# Patient Record
Sex: Male | Born: 2000 | Race: White | Hispanic: No | Marital: Single | Smoking: Never smoker
Health system: Southern US, Community
[De-identification: ages and names within clinical notes are randomized; demographics above are authoritative.]

## PROBLEM LIST (undated history)

## (undated) HISTORY — PX: NO PAST SURGERIES: SHX2092

---
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
## 2023-05-06 IMAGING — DX DG ELBOW COMPLETE 3+V*R*
4 series · 4 of 4 positions shown · non-contrast
Comparison: None.

CLINICAL DATA: Pain medial epicondyle.  Pitcher

EXAM:
RIGHT ELBOW - COMPLETE 3+ VIEW

[elbow ap]
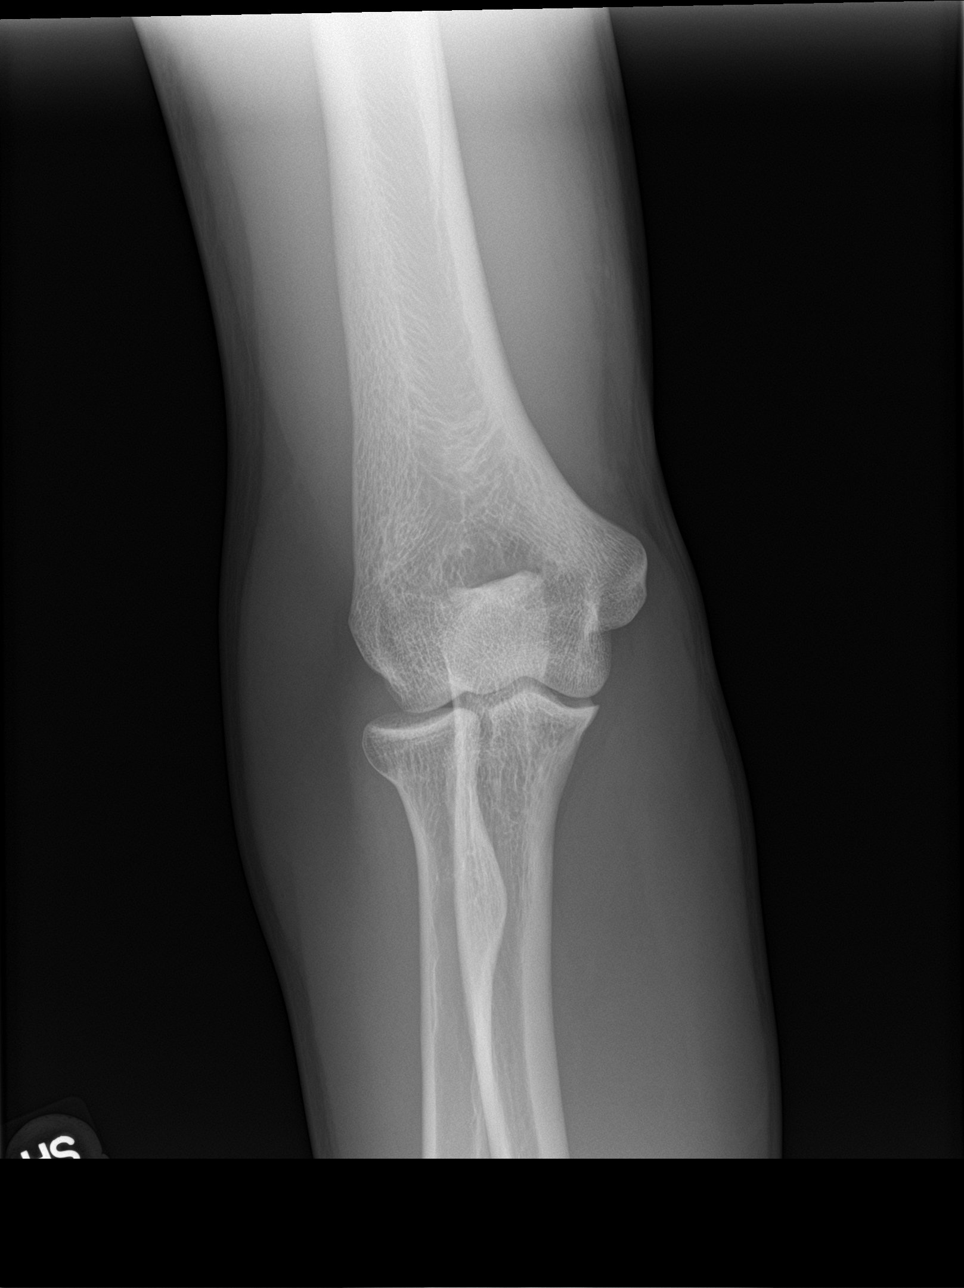

[elbow obl (1 of 2)]
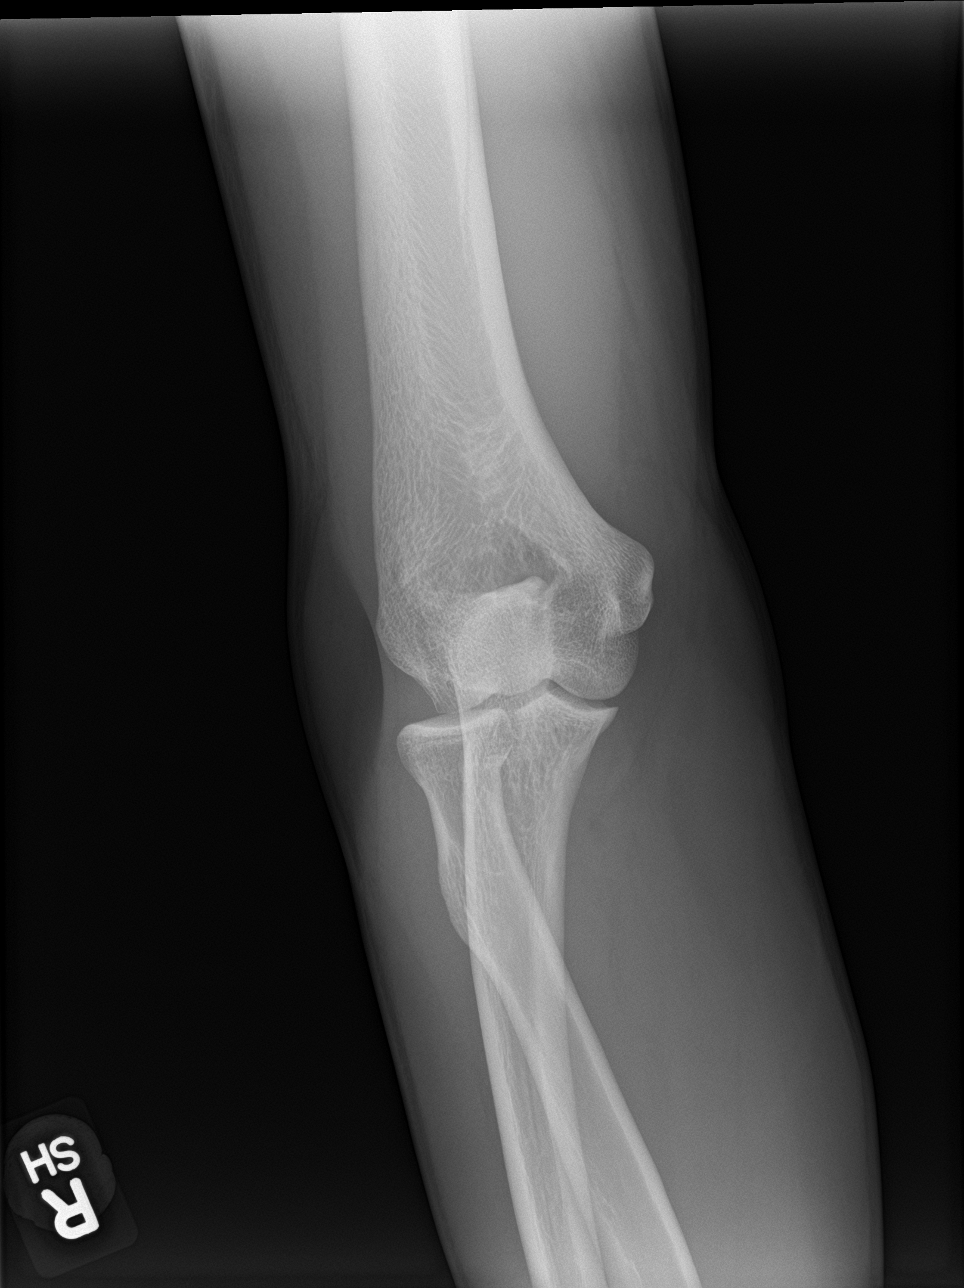

[elbow obl (2 of 2)]
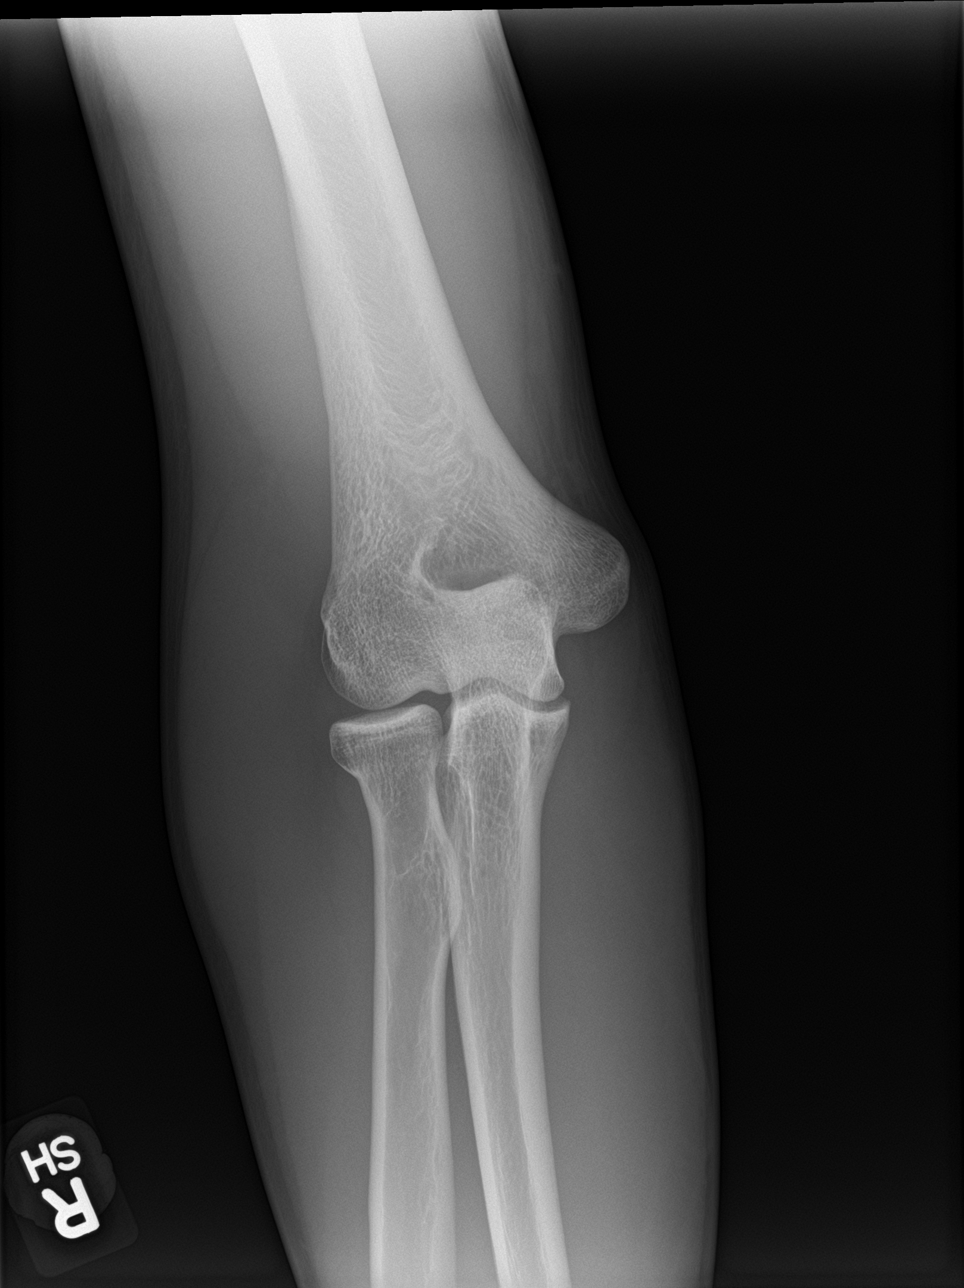

[elbow lat]
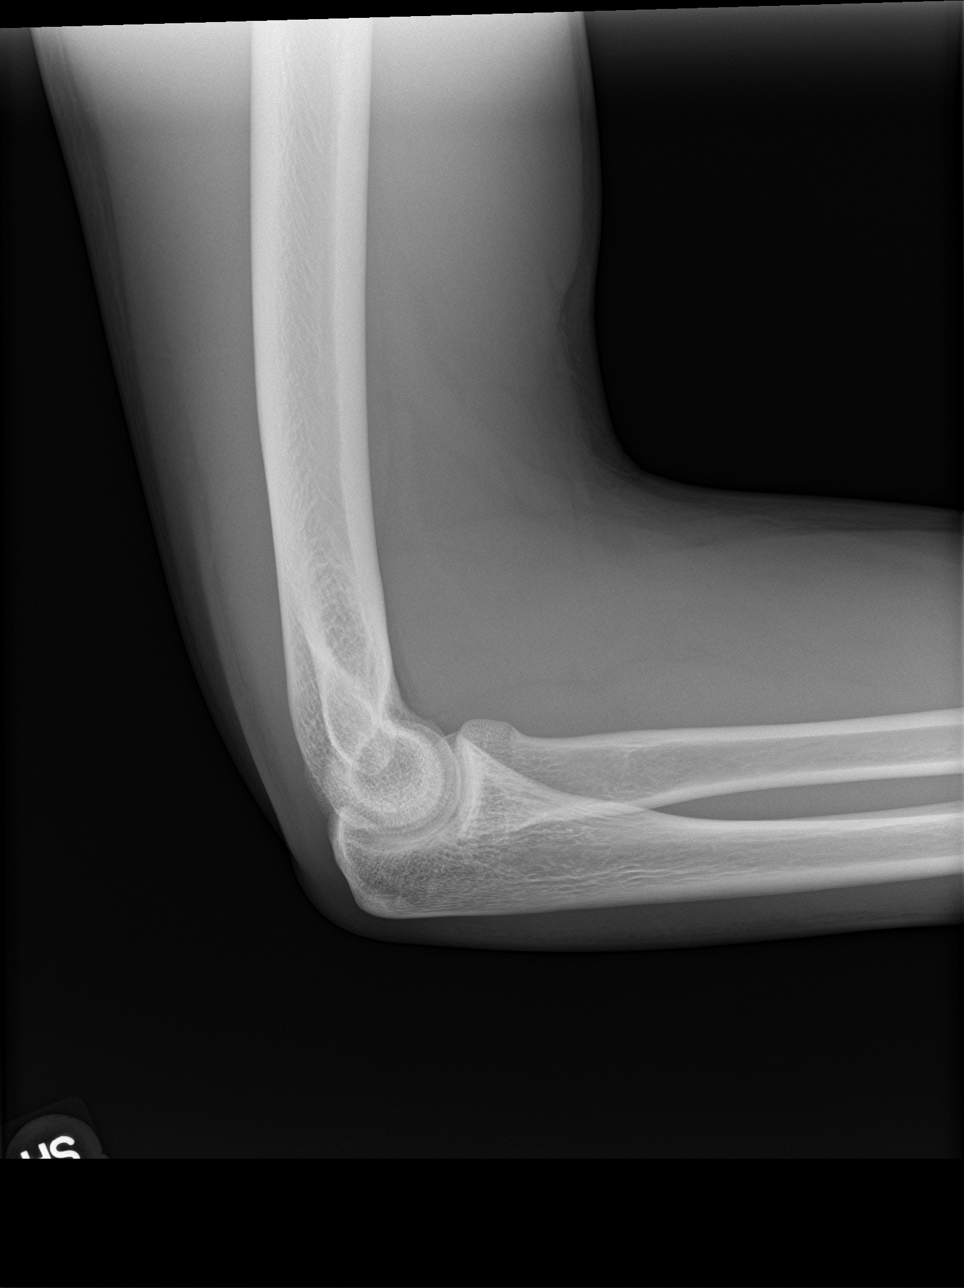

[4 of 4 positions shown; findings below may reference images not displayed]

FINDINGS: There is no evidence of fracture, dislocation, or joint effusion.
There is no evidence of arthropathy or other focal bone abnormality.
Soft tissues are unremarkable.
IMPRESSION: Negative.

## 2023-06-29 ENCOUNTER — Other Ambulatory Visit: Payer: Self-pay

## 2023-06-29 ENCOUNTER — Ambulatory Visit: Attending: Family Medicine

## 2023-06-29 DIAGNOSIS — R600 Localized edema: Secondary | ICD-10-CM | POA: Diagnosis present

## 2023-06-29 DIAGNOSIS — M25511 Pain in right shoulder: Secondary | ICD-10-CM | POA: Diagnosis present

## 2023-06-29 DIAGNOSIS — M6281 Muscle weakness (generalized): Secondary | ICD-10-CM | POA: Diagnosis present

## 2023-06-29 DIAGNOSIS — M25611 Stiffness of right shoulder, not elsewhere classified: Secondary | ICD-10-CM | POA: Diagnosis present

## 2023-06-29 NOTE — Therapy (Addendum)
 OUTPATIENT PHYSICAL THERAPY UPPER EXTREMITY EVALUATION   Patient Name: Brian Holmes MRN: [id number] DOB:August 15, 2000, 23 y.o., male Today's Date: 06/29/2023  END OF SESSION:  PT End of Session - 06/29/23 1009     Visit Number 1    Number of Visits 25    Date for PT Re-Evaluation 09/23/23    Authorization Type [employer]    PT Start Time [phone]    PT Stop Time 1010    PT Time Calculation (min) 36 min    Activity Tolerance Patient tolerated treatment well    Behavior During Therapy [facility] for tasks assessed/performed             History reviewed. No pertinent past medical history. 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    PCP: Joice Nares, MD  REFERRING PROVIDER: Delwin Files, MD  REFERRING DIAG: strain of muscles and tendons of the rotator cuff of right shoulder, subsequent encounter   THERAPY DIAG:  Acute pain of right shoulder  Stiffness of right shoulder, not elsewhere classified  Muscle weakness (generalized)  Localized edema  Rationale for Evaluation and Treatment: Rehabilitation  ONSET DATE: 06/20/23  SUBJECTIVE:                                                                                                                                                                                      SUBJECTIVE STATEMENT: Patient underwent Rt shoulder surgery on 06/20/23. He has been in his sling for all activity except for showering since the surgery. 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 Patient wants to be able to play baseball for fun, but not competively. Pitches Rt-handed; Bats Lt-handed.   Hand dominance: Ambidextrous  PERTINENT HISTORY: Rt shoulder bicep tenodesis  06/20/23  PAIN:  Are you having pain? Yes: NPRS scale: 2 currently; at worst 7 Pain location: Rt anterior shoulder Pain description: sharp Aggravating factors: movement Relieving factors: rest  PRECAUTIONS: Other: see post-op protocol  RED FLAGS: None   WEIGHT BEARING RESTRICTIONS: Yes NWB RUE  FALLS:  Has patient fallen in last 6 months? 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  DIAGNOSTIC FINDINGS:  None on file   PATIENT SURVEYS :  Quick Dash 77.8% disability   COGNITION: Overall cognitive status: Within functional limits for tasks assessed     SENSATION: Not tested  POSTURE: Rounder shoulders in sling   UPPER EXTREMITY ROM: shoulder PROM deferred   Passive ROM Right eval Left eval  Shoulder flexion    Shoulder extension    Shoulder abduction    Shoulder adduction    Shoulder internal rotation    Shoulder external rotation    Elbow flexion Full    Elbow extension Lacking 40   Wrist flexion    Wrist extension    Wrist ulnar deviation    Wrist radial deviation    Wrist pronation    Wrist supination    (Blank rows = not tested)  UPPER EXTREMITY MMT:  MMT Right eval Left eval  Shoulder flexion    Shoulder extension    Shoulder abduction    Shoulder adduction    Shoulder internal rotation    Shoulder external rotation    Middle trapezius    Lower trapezius    Elbow flexion    Elbow extension    Wrist flexion    Wrist extension    Wrist ulnar deviation    Wrist radial deviation    Wrist pronation    Wrist supination    Grip strength (lbs)    (Blank rows = not tested) RUE MMT deferred due to post-op acuity      PALPATION:  Not assessed                                                                                                                               [facility] Adult PT Treatment:                                                DATE: 06/29/23 Therapeutic Exercise: Demonstrated,performed, and issued initial HEP.   Self Care: Reviewed protocol and current precautions Instructed not to submerge with bathing Change dressings as needed  Continue with use of sling Ice for pain and swelling     PATIENT EDUCATION: Education details: see treatment  Person educated: Patient and Parent Education method: Explanation, Demonstration, Tactile cues, Verbal cues, and Handouts Education comprehension: verbalized understanding, returned demonstration, verbal cues required, tactile cues required, and needs further education  HOME EXERCISE PROGRAM: Access Code: [facility] URL: [email or web address] Date: 06/29/2023 Prepared by: Forrestine Ike  Exercises - Supported Elbow Flexion Extension PROM  - 3 x daily - 7 x weekly - 1 sets - 10 reps - Wrist Extension AROM  - 3 x daily - 7 x weekly - 1 sets - 10 reps - Wrist AROM Flexion Extension  - 3 x daily - 7 x weekly - 1 sets - 10 reps - Wrist AROM Radial  Ulnar Deviation  - 3 x daily - 7 x weekly - 1 sets - 10 reps  ASSESSMENT:  CLINICAL IMPRESSION: Patient is a 23 y.o. male who was seen today for physical therapy evaluation and treatment for s/p Rt bicep tenodesis on 06/20/23. He demonstrates RUE ROM and strength deficits that are consistent with his recent post-operative status. He will benefit from skilled PT to assist in restoring his RUE ROM and strength in order to return to PLOF, which includes weight lifting and baseball activity.    OBJECTIVE IMPAIRMENTS: decreased activity tolerance, decreased endurance, decreased knowledge of condition, decreased mobility, decreased ROM, decreased strength, increased edema, impaired UE functional use, postural dysfunction, and pain.   ACTIVITY LIMITATIONS: carrying, lifting, bending, bathing, dressing, reach over  head, and hygiene/grooming  PARTICIPATION LIMITATIONS: meal prep, cleaning, laundry, driving, shopping, community activity, occupation, yard work, and school  PERSONAL FACTORS: Age and 1 comorbidity: recent surgery are also affecting patient's functional outcome.   REHAB POTENTIAL: Excellent  CLINICAL DECISION MAKING: Stable/uncomplicated  EVALUATION COMPLEXITY: Low  GOALS: Goals reviewed with patient? Yes  SHORT TERM GOALS: Target date: 08/10/2023    Patient will be independent and compliant with initial HEP.   Baseline: initial HEP issued  Goal status: INITIAL  2.  Patient will demonstrate full Rt elbow extension AROM to improve ability to complete reaching activity.  Baseline: see above Goal status: INITIAL  3.  Patient will demonstrate at least 120 degrees of Rt shoulder AROM to improve ability to reach overhead.  Baseline: shoulder ROM deferred  Goal status: INITIAL  4.  Patient will demonstrate at least 50 degrees of Rt shoulder ER/IR AROM to improve ability to complete self-care activities.  Baseline: shoulder ROM deferred  Goal status: INITIAL   LONG TERM GOALS: Target date: 09/23/23  Patient will score </= 20 % disability on the QuickDASH (MCID is [id number]) to signify clinically meaningful improvement in functional abilities.   Baseline: see above Goal status: INITIAL  2.  Patient will demonstrate 5/5 Rt shoulder strength to improve ability to throw baseball.  Baseline: deferred  Goal status: INITIAL  3.  Patient will demonstrate 5/5 Rt elbow strength to improve ability to lift/carry items.  Baseline: deferred Goal status: INITIAL  4.  Patient will be able to perform strengthening/stabilization exercises in 90/90 positioning without onset of shoulder pain in order to progress to throwing activity.  Baseline: unable  Goal status: INITIAL  5. Patient will demonstrate 5/5 bilateral middle trap strength to improve postural stability.   Baseline:  deferred  Goal Status: INITIAL    PLAN: PT FREQUENCY: 2x/week  PT DURATION: 12 weeks  PLANNED INTERVENTIONS: 97164- PT Re-evaluation, 97750- Physical Performance Testing, 97110-Therapeutic exercises, 97530- Therapeutic activity, [id number]- Neuromuscular re-education, 97535- Self Care, 82956- Manual therapy, [id number]- Aquatic Therapy, [id number]- Electrical stimulation (unattended), [id number]- Electrical stimulation (manual), 97016- Vasopneumatic device, Taping, Dry Needling, Joint mobilization, Cryotherapy, and Moist heat  PLAN FOR NEXT SESSION: assess shoulder PROM and update HEP to include exercises listed on bicep tenodesis protocol   Forrestine Ike, PT, DPT, ATC 06/29/23 1:09 PM

## 2023-06-30 NOTE — Addendum Note (Signed)
 Addended by: Decarla Siemen C on: 06/30/2023 11:23 AM   Modules accepted: Orders

## 2023-07-04 ENCOUNTER — Ambulatory Visit: Admitting: Physical Therapy

## 2023-07-04 ENCOUNTER — Encounter: Payer: Self-pay | Admitting: Physical Therapy

## 2023-07-04 DIAGNOSIS — M6281 Muscle weakness (generalized): Secondary | ICD-10-CM

## 2023-07-04 DIAGNOSIS — R600 Localized edema: Secondary | ICD-10-CM

## 2023-07-04 DIAGNOSIS — M25511 Pain in right shoulder: Secondary | ICD-10-CM

## 2023-07-04 DIAGNOSIS — M25611 Stiffness of right shoulder, not elsewhere classified: Secondary | ICD-10-CM

## 2023-07-04 NOTE — Therapy (Signed)
 OUTPATIENT PHYSICAL THERAPY UPPER EXTREMITY TREATMENT   Patient Name: Brian Holmes MRN: [id number] DOB:[birth year], 23 y.o., male Today's Date: 07/04/2023  END OF SESSION:  PT End of Session - 07/04/23 1143     Visit Number 2    Number of Visits 25    Date for PT Re-Evaluation 09/23/23    Authorization Type BCBS    PT Start Time 1059    PT Stop Time 1137    PT Time Calculation (min) 38 min    Activity Tolerance Patient tolerated treatment well    Behavior During Therapy [facility] for tasks assessed/performed              History reviewed. No pertinent past medical history. 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    PCP: Joice Nares, MD  REFERRING PROVIDER: Delwin Files, MD  REFERRING DIAG: strain of muscles and tendons of the rotator cuff of right shoulder, subsequent encounter   THERAPY DIAG:  Acute pain of right shoulder  Stiffness of right shoulder, not elsewhere classified  Muscle weakness (generalized)  Localized edema  Rationale for Evaluation and Treatment: Rehabilitation  ONSET DATE: 06/20/23  SUBJECTIVE:                                                                                                                                                                                      SUBJECTIVE STATEMENT: Pt with no new complaints  Hand dominance: Ambidextrous  PERTINENT HISTORY: Rt shoulder bicep tenodesis 06/20/23 Patient underwent Rt shoulder surgery on 06/20/23. He has been in his sling for all activity except for showering since the surgery. 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 Patient wants to be able to play baseball for fun, but not competively.  Pitches Rt-handed; Bats Lt-handed.  PAIN:  Are you having pain? Yes: NPRS scale: 2 currently; at worst 7 Pain location: Rt anterior shoulder Pain description: sharp Aggravating factors: movement Relieving factors: rest  PRECAUTIONS: Other: see post-op protocol  RED FLAGS: None   WEIGHT BEARING RESTRICTIONS: Yes NWB RUE  FALLS:  Has patient fallen in last 6 months? 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  DIAGNOSTIC FINDINGS:  None on file   PATIENT SURVEYS :  Quick Dash 77.8% disability   COGNITION: Overall cognitive status: Within functional limits for tasks assessed     SENSATION: Not tested  POSTURE: Rounder shoulders in sling   UPPER EXTREMITY ROM: shoulder PROM deferred   Passive ROM Right eval Left eval Right 5/27 PROM  Shoulder flexion   At 30 degrees abd: 92  Shoulder extension     Shoulder abduction   35  Shoulder adduction     Shoulder internal rotation     Shoulder external rotation   At 30 degrees abd: 20  Elbow flexion Full     Elbow extension Lacking 40    Wrist flexion     Wrist extension     Wrist ulnar deviation     Wrist radial deviation     Wrist pronation     Wrist supination     (Blank rows = not tested)  UPPER EXTREMITY MMT:  MMT Right eval Left eval  Shoulder flexion    Shoulder extension    Shoulder abduction    Shoulder adduction    Shoulder internal rotation    Shoulder external rotation    Middle trapezius    Lower trapezius    Elbow flexion    Elbow extension    Wrist flexion    Wrist extension    Wrist ulnar deviation    Wrist radial deviation    Wrist pronation    Wrist supination    Grip strength (lbs)    (Blank rows = not tested) RUE MMT deferred due to post-op acuity      PALPATION:   Not assessed                                                                                                                              [facility] Adult PT Treatment:                                                DATE: 07/04/23 Therapeutic Exercise: ROM measurements: see above Pendulums all directions AAROM dowel: abduction, flexion, ER  all per protocol 5 x 10 sec hold Isometrics: flexion, abduction, extension, ER all 5 x 5 sec  Therapeutic Activity: Reviewed protocol and precautions with patient, updated him on prognosis and POC   [facility] Adult PT Treatment:                                                DATE: 06/29/23 Therapeutic Exercise: Demonstrated,performed, and issued initial HEP.   Self Care: Reviewed protocol and current precautions Instructed not to submerge with bathing Change dressings as needed  Continue with use of sling Ice for pain and swelling     PATIENT EDUCATION: Education details: see treatment  Person educated: Patient and Parent Education method: Explanation, Demonstration, Tactile cues, Verbal cues, and Handouts Education comprehension: verbalized understanding, returned demonstration, verbal cues required, tactile cues required, and needs further education  HOME EXERCISE PROGRAM: Access Code: [facility] URL: [email or web address] Date: 07/04/2023 Prepared by: Lowery Rue  Exercises - Supported Elbow Flexion Extension PROM  - 3 x daily - 7 x weekly - 1 sets - 10 reps - Wrist Extension AROM  - 3 x daily - 7 x weekly - 1 sets - 10 reps - Wrist AROM Flexion Extension  - 3 x daily - 7 x weekly - 1 sets - 10 reps - Wrist AROM Radial Ulnar Deviation  - 3 x daily - 7 x weekly - 1 sets - 10 reps - Standing Shoulder Abduction AAROM with Dowel  - 1 x daily - 7 x weekly - 1 sets - 10 reps - 5 seconds hold - Standing Shoulder External Rotation AAROM with Dowel  - 1 x daily - 7 x weekly - 1 sets - 10 reps - 5 seconds hold - Supine Shoulder Flexion  Extension AAROM with Dowel  - 1 x daily - 7 x weekly - 1 sets - 10 reps - 5 seconds hold - Isometric Shoulder Extension at Wall  - 1 x daily - 7 x weekly - 1 sets - 10 reps - 5 seconds hold - Isometric Shoulder Abduction at Wall  - 1 x daily - 7 x weekly - 1 sets - 10 reps - 5 seconds hold - Isometric Shoulder External Rotation at Wall  - 1 x daily - 7 x weekly - 1 sets - 10 reps - 5 seconds hold - Isometric Shoulder Flexion at Wall  - 1 x daily - 7 x weekly - 1 sets - 10 reps - 5 seconds hold  ASSESSMENT:  CLINICAL IMPRESSION: Pt educated updated protocol and HEP. He has deficits in ROM and PT emphasized importance of getting full ROM as tolerated to progress to return to sport eventually. He was able to perform all activities throughout session without increase in pain   OBJECTIVE IMPAIRMENTS: decreased activity tolerance, decreased endurance, decreased knowledge of condition, decreased mobility, decreased ROM, decreased strength, increased edema, impaired UE functional use, postural dysfunction, and pain.     GOALS: Goals reviewed with patient? Yes  SHORT TERM GOALS: Target date: 08/10/2023    Patient will be independent and compliant with initial HEP.   Baseline: initial HEP issued  Goal status: INITIAL  2.  Patient will demonstrate full Rt elbow extension AROM to improve ability to complete reaching activity.  Baseline: see above Goal status: INITIAL  3.  Patient will demonstrate at least 120 degrees of Rt shoulder AROM to improve ability to reach overhead.  Baseline: shoulder ROM deferred  Goal status: INITIAL  4.  Patient will demonstrate at least 50 degrees of Rt shoulder ER/IR AROM to improve ability to complete self-care activities.  Baseline: shoulder ROM deferred  Goal status: INITIAL   LONG TERM GOALS: Target date: 09/23/23  Patient will score </= 20 % disability on the QuickDASH (MCID is 8-15.9) to signify clinically meaningful improvement in functional  abilities.   Baseline: see above Goal status: INITIAL  2.  Patient will demonstrate 5/5 Rt shoulder strength to improve ability to throw baseball.  Baseline: deferred  Goal status: INITIAL  3.  Patient will demonstrate 5/5  Rt elbow strength to improve ability to lift/carry items.  Baseline: deferred Goal status: INITIAL  4.  Patient will be able to perform strengthening/stabilization exercises in 90/90 positioning without onset of shoulder pain in order to progress to throwing activity.  Baseline: unable  Goal status: INITIAL  5. Patient will demonstrate 5/5 bilateral middle trap strength to improve postural stability.   Baseline: deferred  Goal Status: INITIAL    PLAN: PT FREQUENCY: 2x/week  PT DURATION: 12 weeks  PLANNED INTERVENTIONS: 97164- PT Re-evaluation, 97750- Physical Performance Testing, 97110-Therapeutic exercises, 97530- Therapeutic activity, [id number]- Neuromuscular re-education, 97535- Self Care, 16109- Manual therapy, [id number]- Aquatic Therapy, U0454- Electrical stimulation (unattended), [id number]- Electrical stimulation (manual), 97016- Vasopneumatic device, Taping, Dry Needling, Joint mobilization, Cryotherapy, and Moist heat  PLAN FOR NEXT SESSION: continue per protocol  Forrestine Ike, PT, DPT, ATC 07/04/23 11:44 AM

## 2023-07-06 ENCOUNTER — Ambulatory Visit

## 2023-07-06 DIAGNOSIS — M25611 Stiffness of right shoulder, not elsewhere classified: Secondary | ICD-10-CM

## 2023-07-06 DIAGNOSIS — M6281 Muscle weakness (generalized): Secondary | ICD-10-CM

## 2023-07-06 DIAGNOSIS — M25511 Pain in right shoulder: Secondary | ICD-10-CM

## 2023-07-06 DIAGNOSIS — R600 Localized edema: Secondary | ICD-10-CM

## 2023-07-06 NOTE — Therapy (Signed)
 OUTPATIENT PHYSICAL THERAPY UPPER EXTREMITY TREATMENT   Patient Name: Brian Holmes MRN: [id number] DOB:2000-06-28, 23 y.o., male Today's Date: 07/06/2023  END OF SESSION:  PT End of Session - 07/06/23 1451     Visit Number 3    Number of Visits 25    Date for PT Re-Evaluation 09/23/23    Authorization Type BCBS    PT Start Time 1447    PT Stop Time 1530    PT Time Calculation (min) 43 min    Activity Tolerance Patient tolerated treatment well    Behavior During Therapy [facility] for tasks assessed/performed               History reviewed. No pertinent past medical history. 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    PCP: Joice Nares, MD  REFERRING PROVIDER: Delwin Files, MD  REFERRING DIAG: strain of muscles and tendons of the rotator cuff of right shoulder, subsequent encounter   THERAPY DIAG:  Acute pain of right shoulder  Stiffness of right shoulder, not elsewhere classified  Muscle weakness (generalized)  Localized edema  Rationale for Evaluation and Treatment: Rehabilitation  ONSET DATE: 06/20/23  SUBJECTIVE:                                                                                                                                                                                      SUBJECTIVE STATEMENT: Pt had f/u with surgical PA today and was told he could come out of sling at home in 2 weeks and wean as able at that time. Just had stitches removed.   Hand dominance: Ambidextrous  PERTINENT HISTORY: Rt shoulder bicep tenodesis 06/20/23 Patient underwent Rt shoulder surgery on 06/20/23. He has been in his sling for all activity except for showering since the surgery. 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 Patient wants to be able to play baseball for fun, but not competively. Pitches Rt-handed; Bats Lt-handed.  PAIN:  Are you having pain? Yes: NPRS scale: none currently; at worst 5 Pain location: Rt anterior shoulder Pain description: sharp Aggravating factors: movement Relieving factors: rest  PRECAUTIONS: Other: see post-op protocol  RED FLAGS: None   WEIGHT BEARING RESTRICTIONS: Yes NWB RUE  FALLS:  Has patient fallen in last 6 months? 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  DIAGNOSTIC FINDINGS:  None on file   PATIENT SURVEYS :  Quick Dash 77.8% disability   COGNITION: Overall cognitive status: Within functional limits for tasks assessed     SENSATION: Not tested  POSTURE: Rounder shoulders in sling   UPPER EXTREMITY ROM: shoulder PROM deferred   Passive ROM Right eval Left eval Right 5/27 PROM  Shoulder flexion   At 30 degrees abd: 92  Shoulder extension     Shoulder abduction   35  Shoulder adduction     Shoulder internal rotation     Shoulder external rotation   At 30 degrees abd: 20  Elbow flexion Full     Elbow extension Lacking 40    Wrist flexion     Wrist extension     Wrist ulnar deviation     Wrist radial deviation     Wrist pronation     Wrist supination     (Blank rows = not tested)  UPPER EXTREMITY MMT:  MMT Right eval Left eval  Shoulder flexion    Shoulder extension    Shoulder abduction    Shoulder adduction    Shoulder internal rotation    Shoulder external rotation    Middle trapezius    Lower trapezius    Elbow flexion    Elbow extension    Wrist flexion    Wrist extension    Wrist ulnar deviation    Wrist radial deviation    Wrist pronation     Wrist supination    Grip strength (lbs)    (Blank rows = not tested) RUE MMT deferred due to post-op acuity      PALPATION:  Not assessed    [facility] Adult PT Treatment:                                                DATE: 07/06/23  Manual Therapy: Rt shoulder PROM flexion, abduction, ER to tolerance, elbow flexion/extension to tolerance  Neuromuscular re-ed: Scapular retraction x 10  ER isometric x 2; d/c due to pain  Therapeutic Activity: Towel slides flexion x 10 Towel slides abduction x 10  Pulleys flexion x 1 minute  Supine shoulder flexion AAROM with LUE support x 10                                                                                                                         [facility] Adult PT Treatment:  DATE: 07/04/23 Therapeutic Exercise: ROM measurements: see above Pendulums all directions AAROM dowel: abduction, flexion, ER  all per protocol 5 x 10 sec hold Isometrics: flexion, abduction, extension, ER all 5 x 5 sec  Therapeutic Activity: Reviewed protocol and precautions with patient, updated him on prognosis and POC   [facility] Adult PT Treatment:                                                DATE: 06/29/23 Therapeutic Exercise: Demonstrated,performed, and issued initial HEP.   Self Care: Reviewed protocol and current precautions Instructed not to submerge with bathing Change dressings as needed  Continue with use of sling Ice for pain and swelling     PATIENT EDUCATION: Education details: HEP update  Person educated: Patient Education method: Explanation, Demonstration, Tactile cues, Verbal cues, and Handouts Education comprehension: verbalized understanding, returned demonstration, verbal cues required, tactile cues required, and needs further education  HOME EXERCISE PROGRAM: Access Code: [facility] URL: [email or web address] Date: 07/06/2023 Prepared by: Forrestine Ike  Exercises -  Standing Shoulder Abduction AAROM with Dowel  - 1 x daily - 7 x weekly - 1 sets - 10 reps - 5 seconds hold - Standing Shoulder External Rotation AAROM with Dowel  - 1 x daily - 7 x weekly - 1 sets - 10 reps - 5 seconds hold - Supine Shoulder Flexion Extension AAROM with Dowel  - 1 x daily - 7 x weekly - 1 sets - 10 reps - 5 seconds hold - Isometric Shoulder Extension at Wall  - 1 x daily - 7 x weekly - 1 sets - 10 reps - 5 seconds hold - Isometric Shoulder Abduction at Wall  - 1 x daily - 7 x weekly - 1 sets - 10 reps - 5 seconds hold - Isometric Shoulder Flexion at Wall  - 1 x daily - 7 x weekly - 1 sets - 10 reps - 5 seconds hold - Seated Shoulder Flexion Towel Slide at Table Top  - 1 x daily - 7 x weekly - 1 sets - 10 reps - 5 sec  hold - Seated Shoulder Abduction Towel Slide at Table Top  - 1 x daily - 7 x weekly - 1 sets - 10 reps - 5 sec  hold - Seated Scapular Retraction  - 1 x daily - 7 x weekly - 2 sets - 10 reps  ASSESSMENT:  CLINICAL IMPRESSION: Pt with good tolerance to PROM achieving near full flexion and ER passively. Noted pain with ER isometric at bicep, so this was discontinued and removed from HEP at this time. Progressed AAROM with patient able to complete flexion and abduction activity through near full ranges without pain. Cues required to reduce upper trap engagement with scapular retraction.    OBJECTIVE IMPAIRMENTS: decreased activity tolerance, decreased endurance, decreased knowledge of condition, decreased mobility, decreased ROM, decreased strength, increased edema, impaired UE functional use, postural dysfunction, and pain.     GOALS: Goals reviewed with patient? Yes  SHORT TERM GOALS: Target date: 08/10/2023    Patient will be independent and compliant with initial HEP.   Baseline: initial HEP issued  Goal status: INITIAL  2.  Patient will demonstrate full Rt elbow extension AROM to improve ability to complete reaching activity.  Baseline: see above Goal  status: INITIAL  3.  Patient will demonstrate at least 120 degrees  of Rt shoulder AROM to improve ability to reach overhead.  Baseline: shoulder ROM deferred  Goal status: INITIAL  4.  Patient will demonstrate at least 50 degrees of Rt shoulder ER/IR AROM to improve ability to complete self-care activities.  Baseline: shoulder ROM deferred  Goal status: INITIAL   LONG TERM GOALS: Target date: 09/23/23  Patient will score </= 20 % disability on the QuickDASH (MCID is 8-15.9) to signify clinically meaningful improvement in functional abilities.   Baseline: see above Goal status: INITIAL  2.  Patient will demonstrate 5/5 Rt shoulder strength to improve ability to throw baseball.  Baseline: deferred  Goal status: INITIAL  3.  Patient will demonstrate 5/5 Rt elbow strength to improve ability to lift/carry items.  Baseline: deferred Goal status: INITIAL  4.  Patient will be able to perform strengthening/stabilization exercises in 90/90 positioning without onset of shoulder pain in order to progress to throwing activity.  Baseline: unable  Goal status: INITIAL  5. Patient will demonstrate 5/5 bilateral middle trap strength to improve postural stability.   Baseline: deferred  Goal Status: INITIAL    PLAN: PT FREQUENCY: 2x/week  PT DURATION: 12 weeks  PLANNED INTERVENTIONS: 97164- PT Re-evaluation, 97750- Physical Performance Testing, 97110-Therapeutic exercises, 97530- Therapeutic activity, [id number]- Neuromuscular re-education, 97535- Self Care, [id number]- Manual therapy, [id number]- Aquatic Therapy, H8469- Electrical stimulation (unattended), [id number]- Electrical stimulation (manual), 97016- Vasopneumatic device, Taping, Dry Needling, Joint mobilization, Cryotherapy, and Moist heat  PLAN FOR NEXT SESSION: continue per protocol  Forrestine Ike, PT, DPT, ATC 07/06/23 3:32 PM

## 2023-07-11 ENCOUNTER — Ambulatory Visit: Attending: Family Medicine

## 2023-07-11 DIAGNOSIS — M6281 Muscle weakness (generalized): Secondary | ICD-10-CM | POA: Diagnosis present

## 2023-07-11 DIAGNOSIS — M25511 Pain in right shoulder: Secondary | ICD-10-CM

## 2023-07-11 DIAGNOSIS — M25611 Stiffness of right shoulder, not elsewhere classified: Secondary | ICD-10-CM | POA: Diagnosis present

## 2023-07-11 DIAGNOSIS — R600 Localized edema: Secondary | ICD-10-CM

## 2023-07-11 NOTE — Therapy (Signed)
 OUTPATIENT PHYSICAL THERAPY UPPER EXTREMITY TREATMENT   Patient Name: Brian Holmes MRN: [id number] DOB:2000/08/12, 23 y.o., male Today's Date: 07/11/2023  END OF SESSION:  PT End of Session - 07/11/23 1101     Visit Number 4    Number of Visits 25    Date for PT Re-Evaluation 09/23/23    Authorization Type BCBS    PT Start Time 1101    PT Stop Time 1145    PT Time Calculation (min) 44 min    Activity Tolerance Patient tolerated treatment well    Behavior During Therapy [facility] for tasks assessed/performed                History reviewed. No pertinent past medical history. 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    PCP: Joice Nares, MD  REFERRING PROVIDER: Delwin Files, MD  REFERRING DIAG: strain of muscles and tendons of the rotator cuff of right shoulder, subsequent encounter   THERAPY DIAG:  Acute pain of right shoulder  Stiffness of right shoulder, not elsewhere classified  Muscle weakness (generalized)  Localized edema  Rationale for Evaluation and Treatment: Rehabilitation  ONSET DATE: 06/20/23  SUBJECTIVE:                                                                                                                                                                                      SUBJECTIVE STATEMENT: Pt reports the shoulder is feeling ok right now. No pain.   Hand dominance: Ambidextrous  PERTINENT HISTORY: Rt shoulder bicep tenodesis 06/20/23 Patient underwent Rt shoulder surgery on 06/20/23. He has been in his sling for all activity except for showering since the surgery. 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 Patient wants to be able to play  baseball for fun, but not competively. Pitches Rt-handed; Bats Lt-handed.  PAIN:  Are you having pain? Yes: NPRS scale: none currently; at worst 3 Pain location: Rt anterior shoulder Pain description: sharp Aggravating factors: movement Relieving factors: rest  PRECAUTIONS: Other: see post-op protocol  RED FLAGS: None   WEIGHT BEARING RESTRICTIONS: Yes NWB RUE, wean from sling starting at 4 weeks post-op  FALLS:  Has patient fallen in last 6 months? 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  DIAGNOSTIC FINDINGS:  None on file   PATIENT SURVEYS :  Quick Dash 77.8% disability   COGNITION: Overall cognitive status: Within functional limits for tasks assessed     SENSATION: Not tested  POSTURE: Rounder shoulders in sling   UPPER EXTREMITY ROM: shoulder PROM deferred   Passive ROM Right eval Left eval Right 5/27 PROM 07/11/23 Rt PROM  Shoulder flexion   At 30 degrees abd: 92   Shoulder extension      Shoulder abduction   35 130  Shoulder adduction      Shoulder internal rotation      Shoulder external rotation   At 30 degrees abd: 20   Elbow flexion Full      Elbow extension Lacking 40     Wrist flexion      Wrist extension      Wrist ulnar deviation      Wrist radial deviation      Wrist pronation      Wrist supination      (Blank rows = not tested)  UPPER EXTREMITY MMT:  MMT Right eval Left eval  Shoulder flexion    Shoulder extension    Shoulder abduction    Shoulder adduction    Shoulder internal rotation    Shoulder external rotation    Middle trapezius    Lower trapezius    Elbow flexion    Elbow extension    Wrist flexion    Wrist extension    Wrist ulnar deviation    Wrist radial deviation    Wrist pronation    Wrist  supination    Grip strength (lbs)    (Blank rows = not tested) RUE MMT deferred due to post-op acuity      PALPATION:  Not assessed  [facility] Adult PT Treatment:                                                DATE: 07/11/23  Manual Therapy: Rt shoulder flexion,abduction, ER, IR PROM to tolerance Rt elbow extension PROM to tolerance  Neuromuscular re-ed: Prone scapular retraction 2 x 10  ER isometric x 10; 5 sec hold  Therapeutic Activity: Supine shoulder flexion AAROM with dowel x 10  Supine shoulder ER AAROM with dowel x 10  Standing wall towel slides flexion x 10 Standing abduction AAROM x 10  Supine shoulder flexion ROM with physioball x 10    [facility] Adult PT Treatment:                                                DATE: 07/06/23  Manual Therapy: Rt shoulder PROM flexion, abduction, ER to tolerance, elbow flexion/extension to tolerance  Neuromuscular re-ed: Scapular retraction x 10  ER isometric x 2; d/c due to pain  Therapeutic Activity: Towel slides flexion x 10 Towel slides abduction x 10  Pulleys flexion x 1 minute  Supine shoulder flexion AAROM with LUE support x 10  [facility] Adult PT Treatment:                                                DATE: 07/04/23 Therapeutic Exercise: ROM measurements: see above Pendulums all directions AAROM dowel: abduction, flexion, ER  all per protocol 5 x 10 sec hold Isometrics: flexion, abduction, extension, ER all 5 x 5 sec  Therapeutic Activity: Reviewed protocol and precautions with patient, updated him on prognosis and POC    PATIENT EDUCATION: Education details: HEP review  Person educated: Patient Education method: Explanation Education comprehension: verbalized understanding  HOME EXERCISE PROGRAM: Access Code: [facility] URL: [email or web address] Date: 07/06/2023 Prepared by: Forrestine Ike  Exercises - Standing Shoulder Abduction AAROM with Dowel  - 1 x daily - 7 x weekly - 1 sets - 10 reps - 5 seconds hold - Standing Shoulder External Rotation AAROM with Dowel  - 1 x daily - 7 x weekly - 1 sets - 10 reps - 5 seconds hold - Supine Shoulder Flexion Extension AAROM with Dowel  - 1 x daily - 7 x weekly - 1 sets - 10 reps - 5 seconds hold - Isometric Shoulder Extension at Wall  - 1 x daily - 7 x weekly - 1 sets - 10 reps - 5 seconds hold - Isometric Shoulder Abduction at Wall  - 1 x daily - 7 x weekly - 1 sets - 10 reps - 5 seconds hold - Isometric Shoulder Flexion at Wall  - 1 x daily - 7 x weekly - 1 sets - 10 reps - 5 seconds hold - Seated Shoulder Flexion Towel Slide at Table Top  - 1 x daily - 7 x weekly - 1 sets - 10 reps - 5 sec  hold - Seated Shoulder Abduction Towel Slide at Table Top  - 1 x daily - 7 x weekly - 1 sets - 10 reps - 5 sec  hold - Seated Scapular Retraction  - 1 x daily - 7 x weekly - 2 sets - 10 reps  ASSESSMENT:  CLINICAL IMPRESSION: Pt arrives without reports of pain. Tolerates shoulder PROM well with improved range noted in abduction. Progressed shoulder AAROM without onset of shoulder pain. Able to complete isometric ER today without bicep pain. Cues required for proper engagement with prone periscapular strengthening.    OBJECTIVE IMPAIRMENTS: decreased activity tolerance, decreased endurance, decreased knowledge of condition, decreased mobility, decreased ROM, decreased strength, increased edema, impaired UE functional use, postural dysfunction, and pain.     GOALS: Goals reviewed with patient? Yes  SHORT TERM GOALS: Target date: 08/10/2023    Patient will be independent and compliant with initial HEP.   Baseline: initial HEP issued  Goal status: INITIAL  2.  Patient will demonstrate full Rt elbow extension AROM to improve ability to complete reaching activity.  Baseline: see above Goal status: INITIAL  3.  Patient will demonstrate at  least 120 degrees of Rt shoulder AROM to improve ability to reach overhead.  Baseline: shoulder ROM deferred  Goal status: INITIAL  4.  Patient will demonstrate at least 50 degrees of Rt shoulder ER/IR AROM to improve ability to complete self-care activities.  Baseline: shoulder ROM deferred  Goal status: INITIAL   LONG TERM GOALS: Target date: 09/23/23  Patient will score </= 20 % disability on the QuickDASH (MCID is 8-15.9) to signify clinically meaningful improvement  in functional abilities.   Baseline: see above Goal status: INITIAL  2.  Patient will demonstrate 5/5 Rt shoulder strength to improve ability to throw baseball.  Baseline: deferred  Goal status: INITIAL  3.  Patient will demonstrate 5/5 Rt elbow strength to improve ability to lift/carry items.  Baseline: deferred Goal status: INITIAL  4.  Patient will be able to perform strengthening/stabilization exercises in 90/90 positioning without onset of shoulder pain in order to progress to throwing activity.  Baseline: unable  Goal status: INITIAL  5. Patient will demonstrate 5/5 bilateral middle trap strength to improve postural stability.   Baseline: deferred  Goal Status: INITIAL    PLAN: PT FREQUENCY: 2x/week  PT DURATION: 12 weeks  PLANNED INTERVENTIONS: 97164- PT Re-evaluation, 97750- Physical Performance Testing, 97110-Therapeutic exercises, 97530- Therapeutic activity, [id number]- Neuromuscular re-education, 97535- Self Care, 96045- Manual therapy, [id number]- Aquatic Therapy, [id number]- Electrical stimulation (unattended), [id number]- Electrical stimulation (manual), 97016- Vasopneumatic device, Taping, Dry Needling, Joint mobilization, Cryotherapy, and Moist heat  PLAN FOR NEXT SESSION: continue per protocol  Forrestine Ike, PT, DPT, ATC 07/11/23 11:47 AM

## 2023-07-14 ENCOUNTER — Ambulatory Visit

## 2023-07-14 DIAGNOSIS — M25511 Pain in right shoulder: Secondary | ICD-10-CM

## 2023-07-14 DIAGNOSIS — M25611 Stiffness of right shoulder, not elsewhere classified: Secondary | ICD-10-CM

## 2023-07-14 DIAGNOSIS — M6281 Muscle weakness (generalized): Secondary | ICD-10-CM

## 2023-07-14 DIAGNOSIS — R600 Localized edema: Secondary | ICD-10-CM

## 2023-07-14 NOTE — Therapy (Signed)
 OUTPATIENT PHYSICAL THERAPY UPPER EXTREMITY TREATMENT   Patient Name: Brian Holmes MRN: [id number] DOB:May 11, 2000, 23 y.o., male Today's Date: 07/14/2023  END OF SESSION:  PT End of Session - 07/14/23 0931     Visit Number 5    Number of Visits 25    Date for PT Re-Evaluation 09/23/23    Authorization Type BCBS    PT Start Time [phone]    PT Stop Time 1025    PT Time Calculation (min) 54 min    Activity Tolerance Patient tolerated treatment well    Behavior During Therapy [facility] for tasks assessed/performed                 History reviewed. No pertinent past medical history. 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    PCP: Joice Nares, MD  REFERRING PROVIDER: Delwin Files, MD  REFERRING DIAG: strain of muscles and tendons of the rotator cuff of right shoulder, subsequent encounter   THERAPY DIAG:  Acute pain of right shoulder  Stiffness of right shoulder, not elsewhere classified  Muscle weakness (generalized)  Localized edema  Rationale for Evaluation and Treatment: Rehabilitation  ONSET DATE: 06/20/23  SUBJECTIVE:                                                                                                                                                                                      SUBJECTIVE STATEMENT: Pt reports the shoulder is feeling great. No pain right now.   Hand dominance: Ambidextrous  PERTINENT HISTORY: Rt shoulder bicep tenodesis 06/20/23 Patient underwent Rt shoulder surgery on 06/20/23. He has been in his sling for all activity except for showering since the surgery. 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 Patient wants to be able to play  baseball for fun, but not competively. Pitches Rt-handed; Bats Lt-handed.  PAIN:  Are you having pain? Yes: NPRS scale: none currently; at worst 3 Pain location: Rt anterior shoulder Pain description: sharp Aggravating factors: movement Relieving factors: rest  PRECAUTIONS: Other: see post-op protocol  RED FLAGS: None   WEIGHT BEARING RESTRICTIONS: Yes NWB RUE, wean from sling starting at 4 weeks post-op  FALLS:  Has patient fallen in last 6 months? 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  DIAGNOSTIC FINDINGS:  None on file   PATIENT SURVEYS :  Quick Dash 77.8% disability   COGNITION: Overall cognitive status: Within functional limits for tasks assessed     SENSATION: Not tested  POSTURE: Rounder shoulders in sling   UPPER EXTREMITY ROM: shoulder PROM deferred   Passive ROM Right eval Left eval Right 5/27 PROM 07/11/23 Rt PROM 07/14/23 Rt PROM  Shoulder flexion   At 30 degrees abd: 92  Full   Shoulder extension       Shoulder abduction   35 130   Shoulder adduction       Shoulder internal rotation       Shoulder external rotation   At 30 degrees abd: 20    Elbow flexion Full       Elbow extension Lacking 40      Wrist flexion       Wrist extension       Wrist ulnar deviation       Wrist radial deviation       Wrist pronation       Wrist supination       (Blank rows = not tested)  UPPER EXTREMITY MMT:  MMT Right eval Left eval  Shoulder flexion    Shoulder extension    Shoulder abduction    Shoulder adduction    Shoulder internal rotation    Shoulder external rotation    Middle trapezius    Lower trapezius    Elbow flexion    Elbow extension    Wrist flexion    Wrist extension    Wrist ulnar deviation    Wrist radial deviation     Wrist pronation    Wrist supination    Grip strength (lbs)    (Blank rows = not tested) RUE MMT deferred due to post-op acuity      PALPATION:  Not assessed   [facility] Adult PT Treatment:                                                DATE: 07/14/23  Manual Therapy: Rt shoulder flexion,abduction, ER, IR PROM to tolerance Rt elbow extension PROM to tolerance  Neuromuscular re-ed: Sidelying ER 2 x 10  Prone row to neutral 2 x 15 Prone scapular retraction 2 x 10  Therapeutic Activity: Supine shoulder flexion AAROM with dowel x 10  Supine shoulder ER AAROM with dowel x 10  Supine shoulder flexion with physioball inclined x 10  Standing towel slides scaption x 10  Pulleys scaption x 2 minutes  Modalities: Game ready x 10 minutes Rt shoulder medium compression 34 degrees   [facility] Adult PT Treatment:                                                DATE: 07/11/23  Manual Therapy: Rt shoulder flexion,abduction, ER, IR PROM to tolerance Rt elbow extension PROM to tolerance  Neuromuscular re-ed: Prone scapular retraction 2 x 10  ER isometric x 10; 5 sec hold  Therapeutic Activity: Supine shoulder flexion AAROM with dowel x 10  Supine shoulder ER AAROM  with dowel x 10  Standing wall towel slides flexion x 10 Standing abduction AAROM x 10  Supine shoulder flexion ROM with physioball x 10    [facility] Adult PT Treatment:                                                DATE: 07/06/23  Manual Therapy: Rt shoulder PROM flexion, abduction, ER to tolerance, elbow flexion/extension to tolerance  Neuromuscular re-ed: Scapular retraction x 10  ER isometric x 2; d/c due to pain  Therapeutic Activity: Towel slides flexion x 10 Towel slides abduction x 10  Pulleys flexion x 1 minute  Supine shoulder flexion AAROM with LUE support x 10                                           PATIENT EDUCATION: Education details: HEP update  Person educated: Patient Education method: Programmer, multimedia, demo,  cues, handout Education comprehension: verbalized understanding, returned demo, cues   HOME EXERCISE PROGRAM: Access Code: [facility] URL: [email or web address] Date: 07/14/2023 Prepared by: Forrestine Ike  Exercises - Standing Shoulder Abduction AAROM with Dowel  - 1 x daily - 7 x weekly - 1 sets - 10 reps - 5 seconds hold - Standing Shoulder External Rotation AAROM with Dowel  - 1 x daily - 7 x weekly - 1 sets - 10 reps - 5 seconds hold - Isometric Shoulder Extension at Wall  - 1 x daily - 7 x weekly - 1 sets - 10 reps - 5 seconds hold - Isometric Shoulder Abduction at Wall  - 1 x daily - 7 x weekly - 1 sets - 10 reps - 5 seconds hold - Seated Shoulder Abduction Towel Slide at Table Top  - 1 x daily - 7 x weekly - 1 sets - 10 reps - 5 sec  hold - Seated Scapular Retraction  - 1 x daily - 7 x weekly - 2 sets - 10 reps - Sidelying Shoulder External Rotation  - 1 x daily - 7 x weekly - 2 sets - 10 reps - Prone Scapular Retraction and Row  - 1 x daily - 7 x weekly - 2 sets - 10 reps - Shoulder Flexion Wall Slide with Towel  - 1 x daily - 7 x weekly - 1 sets - 10 reps - 5 sec  hold  ASSESSMENT:  CLINICAL IMPRESSION: Patient with good tolerance to shoulder PROM achieving full flexion ROM. Progressed ROM activity per protocol without onset of pain. With prone row to neutral requires initial cues for proper periscapular activation. Able to introduce AAROM in more gravity dependent positioning with good tolerance, though limited with scaption ROM.     OBJECTIVE IMPAIRMENTS: decreased activity tolerance, decreased endurance, decreased knowledge of condition, decreased mobility, decreased ROM, decreased strength, increased edema, impaired UE functional use, postural dysfunction, and pain.     GOALS: Goals reviewed with patient? Yes  SHORT TERM GOALS: Target date: 08/10/2023    Patient will be independent and compliant with initial HEP.   Baseline: initial HEP issued  Goal  status: MET  2.  Patient will demonstrate full Rt elbow extension AROM to improve ability to complete reaching activity.  Baseline: see above Goal status: INITIAL  3.  Patient will  demonstrate at least 120 degrees of Rt shoulder AROM to improve ability to reach overhead.  Baseline: shoulder ROM deferred  Goal status: INITIAL  4.  Patient will demonstrate at least 50 degrees of Rt shoulder ER/IR AROM to improve ability to complete self-care activities.  Baseline: shoulder ROM deferred  Goal status: INITIAL   LONG TERM GOALS: Target date: 09/23/23  Patient will score </= 20 % disability on the QuickDASH (MCID is 8-15.9) to signify clinically meaningful improvement in functional abilities.   Baseline: see above Goal status: INITIAL  2.  Patient will demonstrate 5/5 Rt shoulder strength to improve ability to throw baseball.  Baseline: deferred  Goal status: INITIAL  3.  Patient will demonstrate 5/5 Rt elbow strength to improve ability to lift/carry items.  Baseline: deferred Goal status: INITIAL  4.  Patient will be able to perform strengthening/stabilization exercises in 90/90 positioning without onset of shoulder pain in order to progress to throwing activity.  Baseline: unable  Goal status: INITIAL  5. Patient will demonstrate 5/5 bilateral middle trap strength to improve postural stability.   Baseline: deferred  Goal Status: INITIAL    PLAN: PT FREQUENCY: 2x/week  PT DURATION: 12 weeks  PLANNED INTERVENTIONS: 97164- PT Re-evaluation, 97750- Physical Performance Testing, 97110-Therapeutic exercises, 97530- Therapeutic activity, [id number]- Neuromuscular re-education, 97535- Self Care, 16109- Manual therapy, [id number]- Aquatic Therapy, U0454- Electrical stimulation (unattended), [id number]- Electrical stimulation (manual), 97016- Vasopneumatic device, Taping, Dry Needling, Joint mobilization, Cryotherapy, and Moist heat  PLAN FOR NEXT SESSION: continue per protocol; wean from sling at 4  weeks.   Xaivier Malay, PT, DPT, ATC 07/14/23 10:37 AM

## 2023-07-18 ENCOUNTER — Ambulatory Visit

## 2023-07-18 DIAGNOSIS — M25511 Pain in right shoulder: Secondary | ICD-10-CM | POA: Diagnosis not present

## 2023-07-18 DIAGNOSIS — M6281 Muscle weakness (generalized): Secondary | ICD-10-CM

## 2023-07-18 DIAGNOSIS — R600 Localized edema: Secondary | ICD-10-CM

## 2023-07-18 DIAGNOSIS — M25611 Stiffness of right shoulder, not elsewhere classified: Secondary | ICD-10-CM

## 2023-07-18 NOTE — Therapy (Signed)
 OUTPATIENT PHYSICAL THERAPY UPPER EXTREMITY TREATMENT   Patient Name: Brian Holmes MRN: [id number] DOB:[birth year], 23 y.o., male Today's Date: 07/18/2023  END OF SESSION:  PT End of Session - 07/18/23 0803     Visit Number 6    Number of Visits 25    Date for PT Re-Evaluation 09/23/23    Authorization Type BCBS    PT Start Time 0803    PT Stop Time 0851    PT Time Calculation (min) 48 min    Activity Tolerance Patient tolerated treatment well    Behavior During Therapy [facility] for tasks assessed/performed                  History reviewed. No pertinent past medical history. 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    PCP: Joice Nares, MD  REFERRING PROVIDER: Delwin Files, MD  REFERRING DIAG: strain of muscles and tendons of the rotator cuff of right shoulder, subsequent encounter   THERAPY DIAG:  Acute pain of right shoulder  Stiffness of right shoulder, not elsewhere classified  Muscle weakness (generalized)  Localized edema  Rationale for Evaluation and Treatment: Rehabilitation  ONSET DATE: 06/20/23  SUBJECTIVE:                                                                                                                                                                                      SUBJECTIVE STATEMENT: "Feels good. No pain."   Hand dominance: Ambidextrous  PERTINENT HISTORY: Rt shoulder bicep tenodesis 06/20/23 Patient underwent Rt shoulder surgery on 06/20/23. He has been in his sling for all activity except for showering since the surgery. 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 Patient wants to be able to play baseball for fun, but not  competively. Pitches Rt-handed; Bats Lt-handed.  PAIN:  Are you having pain? No  PRECAUTIONS: Other: see post-op protocol  RED FLAGS: None   WEIGHT BEARING RESTRICTIONS: Yes WBAT RUE  FALLS:  Has patient fallen in last 6 months? 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  DIAGNOSTIC FINDINGS:  None on file   PATIENT SURVEYS :  Quick Dash 77.8% disability   COGNITION: Overall cognitive status: Within functional limits for tasks assessed     SENSATION: Not tested  POSTURE: Rounder shoulders in sling   UPPER EXTREMITY ROM: shoulder PROM deferred   Passive ROM Right eval Left eval Right 5/27 PROM 07/11/23 Rt PROM 07/14/23 Rt PROM 07/18/23 Rt AROM  Shoulder flexion   At 30 degrees abd: 92  Full  169  Shoulder extension        Shoulder abduction   35 130    Shoulder adduction        Shoulder internal rotation        Shoulder external rotation   At 30 degrees abd: 20     Elbow flexion Full        Elbow extension Lacking 40     0  Wrist flexion        Wrist extension        Wrist ulnar deviation        Wrist radial deviation        Wrist pronation        Wrist supination        (Blank rows = not tested)  UPPER EXTREMITY MMT:  MMT Right eval Left eval  Shoulder flexion    Shoulder extension    Shoulder abduction    Shoulder adduction    Shoulder internal rotation    Shoulder external rotation    Middle trapezius    Lower trapezius    Elbow flexion    Elbow extension    Wrist flexion    Wrist extension    Wrist ulnar deviation    Wrist radial deviation    Wrist pronation    Wrist supination    Grip strength (lbs)    (Blank rows = not tested) RUE MMT deferred due to post-op acuity      PALPATION:  Not assessed  [facility] Adult PT  Treatment:                                                DATE: 07/18/23  Neuromuscular re-ed: ER/IR reactive isometrics 2 x 10 green band  Sidelying ER 2 x 10  Resisted row blue band 2 x 15  Supine serratus punch x 10  Therapeutic Activity: Pulleys flexion/scaption x 1 minute each  Finger ladder flexion x 5  Finger ladder abduction x 5  Supine shoulder flexion AROM x 10  Sidelying shoulder abduction x 10  Modalities: Game ready x 10 minutes Rt shoulder medium compression 34 degrees Self Care: Discussed weaning from sling per post-operative protocol, lifting restrictions    [facility] Adult PT Treatment:                                                DATE: 07/14/23  Manual Therapy: Rt shoulder flexion,abduction, ER, IR PROM to tolerance Rt elbow extension PROM to tolerance  Neuromuscular re-ed: Sidelying ER 2 x 10  Prone row to neutral 2 x 15 Prone scapular retraction 2 x 10  Therapeutic Activity: Supine shoulder flexion AAROM with dowel x 10  Supine shoulder ER AAROM with dowel x 10  Supine shoulder flexion with physioball inclined x 10  Standing towel slides scaption x  10  Pulleys scaption x 2 minutes  Modalities: Game ready x 10 minutes Rt shoulder medium compression 34 degrees   [facility] Adult PT Treatment:                                                DATE: 07/11/23  Manual Therapy: Rt shoulder flexion,abduction, ER, IR PROM to tolerance Rt elbow extension PROM to tolerance  Neuromuscular re-ed: Prone scapular retraction 2 x 10  ER isometric x 10; 5 sec hold  Therapeutic Activity: Supine shoulder flexion AAROM with dowel x 10  Supine shoulder ER AAROM with dowel x 10  Standing wall towel slides flexion x 10 Standing abduction AAROM x 10  Supine shoulder flexion ROM with physioball x 10                                            PATIENT EDUCATION: Education details: HEP update  Person educated: Patient Education method: Programmer, multimedia, demo, cues, handout Education  comprehension: verbalized understanding, returned demo, cues   HOME EXERCISE PROGRAM: Access Code: [facility] URL: [email or web address] Date: 07/18/2023 Prepared by: Forrestine Ike  Exercises - Standing Shoulder Abduction AAROM with Dowel  - 1 x daily - 7 x weekly - 1 sets - 10 reps - 5 seconds hold - Seated Scapular Retraction  - 1 x daily - 7 x weekly - 2 sets - 10 reps - Prone Scapular Retraction and Row  - 1 x daily - 7 x weekly - 2 sets - 10 reps - Shoulder External Rotation Reactive Isometrics  - 1 x daily - 7 x weekly - 2 sets - 10 reps - Shoulder Internal Rotation Reactive Isometrics  - 1 x daily - 7 x weekly - 2 sets - 10 reps - Sidelying Shoulder Abduction  - 1 x daily - 7 x weekly - 2 sets - 10 reps - Sidelying Shoulder External Rotation  - 1 x daily - 7 x weekly - 2 sets - 10 reps - Supine Shoulder Flexion Extension Full Range AROM  - 1 x daily - 7 x weekly - 2 sets - 10 reps  ASSESSMENT:  CLINICAL IMPRESSION: Patient is progressing well at 4 weeks post-op. We discussed weaning from sling today per protocol with patient verbalizing understanding. Continued with Rt shoulder AAROM and AROM with good tolerance. He has near full Rt shoulder flexion AROM in gravity eliminated postioning. Progressed into reactive isometrics with patient fatiguing, but no onset of pain.    OBJECTIVE IMPAIRMENTS: decreased activity tolerance, decreased endurance, decreased knowledge of condition, decreased mobility, decreased ROM, decreased strength, increased edema, impaired UE functional use, postural dysfunction, and pain.     GOALS: Goals reviewed with patient? Yes  SHORT TERM GOALS: Target date: 08/10/2023    Patient will be independent and compliant with initial HEP.   Baseline: initial HEP issued  Goal status: MET  2.  Patient will demonstrate full Rt elbow extension AROM to improve ability to complete reaching activity.  Baseline: see above Goal status: MET  3.   Patient will demonstrate at least 120 degrees of Rt shoulder AROM to improve ability to reach overhead.  Baseline: shoulder ROM deferred  Goal status: MET  4.  Patient will demonstrate at least 50 degrees of  Rt shoulder ER/IR AROM to improve ability to complete self-care activities.  Baseline: shoulder ROM deferred  Goal status: INITIAL   LONG TERM GOALS: Target date: 09/23/23  Patient will score </= 20 % disability on the QuickDASH (MCID is 8-15.9) to signify clinically meaningful improvement in functional abilities.   Baseline: see above Goal status: INITIAL  2.  Patient will demonstrate 5/5 Rt shoulder strength to improve ability to throw baseball.  Baseline: deferred  Goal status: INITIAL  3.  Patient will demonstrate 5/5 Rt elbow strength to improve ability to lift/carry items.  Baseline: deferred Goal status: INITIAL  4.  Patient will be able to perform strengthening/stabilization exercises in 90/90 positioning without onset of shoulder pain in order to progress to throwing activity.  Baseline: unable  Goal status: INITIAL  5. Patient will demonstrate 5/5 bilateral middle trap strength to improve postural stability.   Baseline: deferred  Goal Status: INITIAL    PLAN: PT FREQUENCY: 2x/week  PT DURATION: 12 weeks  PLANNED INTERVENTIONS: 97164- PT Re-evaluation, 97750- Physical Performance Testing, 97110-Therapeutic exercises, 97530- Therapeutic activity, [id number]- Neuromuscular re-education, 97535- Self Care, 16109- Manual therapy, [id number]- Aquatic Therapy, U0454- Electrical stimulation (unattended), [id number]- Electrical stimulation (manual), 97016- Vasopneumatic device, Taping, Dry Needling, Joint mobilization, Cryotherapy, and Moist heat  PLAN FOR NEXT SESSION: continue per protocol;   Tylique Aull, PT, DPT, ATC 07/18/23 8:44 AM

## 2023-07-20 ENCOUNTER — Ambulatory Visit

## 2023-07-20 DIAGNOSIS — M25611 Stiffness of right shoulder, not elsewhere classified: Secondary | ICD-10-CM

## 2023-07-20 DIAGNOSIS — M25511 Pain in right shoulder: Secondary | ICD-10-CM | POA: Diagnosis not present

## 2023-07-20 DIAGNOSIS — M6281 Muscle weakness (generalized): Secondary | ICD-10-CM

## 2023-07-20 DIAGNOSIS — R600 Localized edema: Secondary | ICD-10-CM

## 2023-07-20 NOTE — Therapy (Signed)
 OUTPATIENT PHYSICAL THERAPY UPPER EXTREMITY TREATMENT   Patient Name: Brian Holmes MRN: [id number] DOB:2000/10/25, 23 y.o., male Today's Date: 07/20/2023  END OF SESSION:  PT End of Session - 07/20/23 0852     Visit Number 7    Number of Visits 25    Date for PT Re-Evaluation 09/23/23    Authorization Type BCBS    PT Start Time 0850    PT Stop Time 0940    PT Time Calculation (min) 50 min    Activity Tolerance Patient tolerated treatment well    Behavior During Therapy [facility] for tasks assessed/performed                History reviewed. No pertinent past medical history. 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    PCP: Joice Nares, MD  REFERRING PROVIDER: Delwin Files, MD  REFERRING DIAG: strain of muscles and tendons of the rotator cuff of right shoulder, subsequent encounter   THERAPY DIAG:  Acute pain of right shoulder  Stiffness of right shoulder, not elsewhere classified  Muscle weakness (generalized)  Localized edema  Rationale for Evaluation and Treatment: Rehabilitation  ONSET DATE: 06/20/23  SUBJECTIVE:                                                                                                                                                                                      SUBJECTIVE STATEMENT: Feels great. Only had to wear sling during a long walk since last session.   Hand dominance: Ambidextrous  PERTINENT HISTORY: Rt shoulder bicep tenodesis 06/20/23 Patient underwent Rt shoulder surgery on 06/20/23. He has been in his sling for all activity except for showering since the surgery. 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 Patient wants to  be able to play baseball for fun, but not competively. Pitches Rt-handed; Bats Lt-handed.  PAIN:  Are you having pain? No  PRECAUTIONS: Other: see post-op protocol  RED FLAGS: None   WEIGHT BEARING RESTRICTIONS: Yes WBAT RUE  FALLS:  Has patient fallen in last 6 months? 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   NEXT MD VISIT: 08/17/23  OBJECTIVE:  Note: Objective measures were completed at Evaluation unless otherwise noted.  DIAGNOSTIC FINDINGS:  None on file   PATIENT SURVEYS :  Quick Dash 77.8% disability   COGNITION: Overall cognitive status: Within functional limits for tasks assessed     SENSATION: Not tested  POSTURE: Rounder shoulders in sling   UPPER EXTREMITY ROM: shoulder PROM deferred   Passive ROM Right eval Left eval Right 5/27 PROM 07/11/23 Rt PROM 07/14/23 Rt PROM 07/18/23 Rt AROM  Shoulder flexion   At 30 degrees abd: 92  Full  169  Shoulder extension        Shoulder abduction   35 130    Shoulder adduction        Shoulder internal rotation        Shoulder external rotation   At 30 degrees abd: 20     Elbow flexion Full        Elbow extension Lacking 40     0  Wrist flexion        Wrist extension        Wrist ulnar deviation        Wrist radial deviation        Wrist pronation        Wrist supination        (Blank rows = not tested)  UPPER EXTREMITY MMT:  MMT Right eval Left eval  Shoulder flexion    Shoulder extension    Shoulder abduction    Shoulder adduction    Shoulder internal rotation    Shoulder external rotation    Middle trapezius    Lower trapezius    Elbow flexion    Elbow extension    Wrist flexion    Wrist extension    Wrist ulnar deviation    Wrist radial deviation    Wrist pronation    Wrist supination    Grip strength (lbs)    (Blank rows = not tested) RUE MMT deferred due to post-op acuity       PALPATION:  Not assessed   [facility] Adult PT Treatment:                                                DATE: 07/20/23 Therapeutic Exercise: Resistd tricep extension 2 x 10 @ green band   Neuromuscular re-ed: Sidelying ER 2 x 10 @ 1 lb Resisted shoulder extension green band 2 x 15  Prone T on physioball 2 x 10 Prone W on physioball 2 x 10   Bilateral shoulder ER 2 x 10  Dead bug 2 x 10  Therapeutic Activity: Pulleys x 1 min each flexion/abduction  Inclined shoulder flexion AROM x 10  Inclined shoulder scaption AROM x 10  Sidelying shoulder abduction AROM  Modalities: Game ready x 10 minutes Rt shoulder medium compression 34 degrees   [facility] Adult PT Treatment:                                                DATE: 07/18/23  Neuromuscular re-ed: ER/IR reactive isometrics 2 x 10 green band  Sidelying ER 2 x 10  Resisted row blue band 2 x 15  Supine serratus punch x 10  Therapeutic Activity: Pulleys flexion/scaption x 1 minute each  Finger ladder flexion x 5  Finger ladder abduction x 5  Supine shoulder flexion AROM x 10  Sidelying shoulder abduction x 10  Modalities: Game ready x 10 minutes Rt shoulder medium compression 34 degrees Self Care: Discussed weaning from sling per post-operative protocol, lifting restrictions    [facility] Adult PT Treatment:                                                DATE: 07/14/23  Manual Therapy: Rt shoulder flexion,abduction, ER, IR PROM to tolerance Rt elbow extension PROM to tolerance  Neuromuscular re-ed: Sidelying ER 2 x 10  Prone row to neutral 2 x 15 Prone scapular retraction 2 x 10  Therapeutic Activity: Supine shoulder flexion AAROM with dowel x 10  Supine shoulder ER AAROM with dowel x 10  Supine shoulder flexion with physioball inclined x 10  Standing towel slides scaption x 10  Pulleys scaption x 2 minutes  Modalities: Game ready x 10 minutes Rt shoulder medium compression 34 degrees                                            PATIENT EDUCATION: Education details: HEP review   Person educated: Patient Education method: Programmer, multimedia,  Education comprehension: verbalized understanding,   HOME EXERCISE PROGRAM: Access Code: [facility] URL: [email or web address] Date: 07/18/2023 Prepared by: Forrestine Ike  Exercises - Standing Shoulder Abduction AAROM with Dowel  - 1 x daily - 7 x weekly - 1 sets - 10 reps - 5 seconds hold - Seated Scapular Retraction  - 1 x daily - 7 x weekly - 2 sets - 10 reps - Prone Scapular Retraction and Row  - 1 x daily - 7 x weekly - 2 sets - 10 reps - Shoulder External Rotation Reactive Isometrics  - 1 x daily - 7 x weekly - 2 sets - 10 reps - Shoulder Internal Rotation Reactive Isometrics  - 1 x daily - 7 x weekly - 2 sets - 10 reps - Sidelying Shoulder Abduction  - 1 x daily - 7 x weekly - 2 sets - 10 reps - Sidelying Shoulder External Rotation  - 1 x daily - 7 x weekly - 2 sets - 10 reps - Supine Shoulder Flexion Extension Full Range AROM  - 1 x daily - 7 x weekly - 2 sets - 10 reps  ASSESSMENT:  CLINICAL IMPRESSION: Patient arrives without reports of pain and has done well weaning from sling over the past 2 days. Progressed shoulder AROM into gravity dependent positioning without onset of shoulder pain. He does report occasional popping in the shoulder that is non-painful with ROM activity. Difficulty controlling for compensatory upper trap engagement with prone periscapular strengthening. Began to incorporate core stabilization into shoulder activity with good tolerance.   OBJECTIVE IMPAIRMENTS: decreased activity tolerance, decreased endurance, decreased knowledge of condition, decreased mobility, decreased ROM, decreased strength, increased edema, impaired UE functional use, postural dysfunction, and pain.     GOALS: Goals reviewed with patient? Yes  SHORT TERM GOALS: Target date: 08/10/2023    Patient will be independent and compliant with initial HEP.    Baseline: initial HEP issued  Goal status: MET  2.  Patient will demonstrate full Rt elbow extension AROM to improve ability to complete reaching activity.  Baseline: see above Goal status:  MET  3.  Patient will demonstrate at least 120 degrees of Rt shoulder AROM to improve ability to reach overhead.  Baseline: shoulder ROM deferred  Goal status: MET  4.  Patient will demonstrate at least 50 degrees of Rt shoulder ER/IR AROM to improve ability to complete self-care activities.  Baseline: shoulder ROM deferred  Goal status: INITIAL   LONG TERM GOALS: Target date: 09/23/23  Patient will score </= 20 % disability on the QuickDASH (MCID is 8-15.9) to signify clinically meaningful improvement in functional abilities.   Baseline: see above Goal status: INITIAL  2.  Patient will demonstrate 5/5 Rt shoulder strength to improve ability to throw baseball.  Baseline: deferred  Goal status: INITIAL  3.  Patient will demonstrate 5/5 Rt elbow strength to improve ability to lift/carry items.  Baseline: deferred Goal status: INITIAL  4.  Patient will be able to perform strengthening/stabilization exercises in 90/90 positioning without onset of shoulder pain in order to progress to throwing activity.  Baseline: unable  Goal status: INITIAL  5. Patient will demonstrate 5/5 bilateral middle trap strength to improve postural stability.   Baseline: deferred  Goal Status: INITIAL    PLAN: PT FREQUENCY: 2x/week  PT DURATION: 12 weeks  PLANNED INTERVENTIONS: 97164- PT Re-evaluation, 97750- Physical Performance Testing, 97110-Therapeutic exercises, 97530- Therapeutic activity, [id number]- Neuromuscular re-education, 97535- Self Care, 16109- Manual therapy, [id number]- Aquatic Therapy, U0454- Electrical stimulation (unattended), [id number]- Electrical stimulation (manual), 97016- Vasopneumatic device, Taping, Dry Needling, Joint mobilization, Cryotherapy, and Moist heat  PLAN FOR NEXT SESSION: continue per  protocol;   Shiro Ellerman, PT, DPT, ATC 07/20/23 9:31 AM

## 2023-07-25 ENCOUNTER — Ambulatory Visit

## 2023-07-25 DIAGNOSIS — M6281 Muscle weakness (generalized): Secondary | ICD-10-CM

## 2023-07-25 DIAGNOSIS — M25511 Pain in right shoulder: Secondary | ICD-10-CM

## 2023-07-25 DIAGNOSIS — R600 Localized edema: Secondary | ICD-10-CM

## 2023-07-25 DIAGNOSIS — M25611 Stiffness of right shoulder, not elsewhere classified: Secondary | ICD-10-CM

## 2023-07-25 NOTE — Therapy (Signed)
 OUTPATIENT PHYSICAL THERAPY UPPER EXTREMITY TREATMENT   Patient Name: Brian Holmes MRN: [id number] DOB:[birth year], 23 y.o., male Today's Date: 07/25/2023  END OF SESSION:  PT End of Session - 07/25/23 0803     Visit Number 8    Number of Visits 25    Date for PT Re-Evaluation 09/23/23    Authorization Type BCBS    PT Start Time 0803    PT Stop Time 0853    PT Time Calculation (min) 50 min    Activity Tolerance Patient tolerated treatment well    Behavior During Therapy [facility] for tasks assessed/performed                 History reviewed. No pertinent past medical history. 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    PCP: Joice Nares, MD  REFERRING PROVIDER: Delwin Files, MD  REFERRING DIAG: strain of muscles and tendons of the rotator cuff of right shoulder, subsequent encounter   THERAPY DIAG:  Acute pain of right shoulder  Stiffness of right shoulder, not elsewhere classified  Muscle weakness (generalized)  Localized edema  Rationale for Evaluation and Treatment: Rehabilitation  ONSET DATE: 06/20/23  SUBJECTIVE:                                                                                                                                                                                      SUBJECTIVE STATEMENT: Feels good. No pain.   Hand dominance: Ambidextrous  PERTINENT HISTORY: Rt shoulder bicep tenodesis 06/20/23 Patient underwent Rt shoulder surgery on 06/20/23. He has been in his sling for all activity except for showering since the surgery. 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 Patient wants to be able to play baseball for fun, but not  competively. Pitches Rt-handed; Bats Lt-handed.  PAIN:  Are you having pain? No  PRECAUTIONS: Other: see post-op protocol  RED FLAGS: None   WEIGHT BEARING RESTRICTIONS: Yes WBAT RUE  FALLS:  Has patient fallen in last 6 months? 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   NEXT MD VISIT: 08/17/23  OBJECTIVE:  Note: Objective measures were completed at Evaluation unless otherwise noted.  DIAGNOSTIC FINDINGS:  None on file   PATIENT SURVEYS :  Quick Dash 77.8% disability   COGNITION: Overall cognitive status: Within functional limits for tasks assessed     SENSATION: Not tested  POSTURE: Rounder shoulders in sling   UPPER EXTREMITY ROM: shoulder PROM deferred   Passive ROM Right eval Left eval Right 5/27 PROM 07/11/23 Rt PROM 07/14/23 Rt PROM 07/18/23 Rt AROM 07/25/23 Rt AROM  Shoulder flexion   At 30 degrees abd: 92  Full  169   Shoulder extension         Shoulder abduction   35 130     Shoulder adduction         Shoulder internal rotation       61  Shoulder external rotation   At 30 degrees abd: 20    66  Elbow flexion Full         Elbow extension Lacking 40     0   Wrist flexion         Wrist extension         Wrist ulnar deviation         Wrist radial deviation         Wrist pronation         Wrist supination         (Blank rows = not tested)  UPPER EXTREMITY MMT:  MMT Right eval Left eval  Shoulder flexion    Shoulder extension    Shoulder abduction    Shoulder adduction    Shoulder internal rotation    Shoulder external rotation    Middle trapezius    Lower trapezius    Elbow flexion    Elbow extension    Wrist flexion    Wrist extension    Wrist ulnar deviation    Wrist radial deviation    Wrist pronation    Wrist supination    Grip strength (lbs)    (Blank rows = not tested) RUE MMT deferred due to post-op acuity      PALPATION:   Not assessed  [facility] Adult PT Treatment:                                                DATE: 07/25/23  Neuromuscular re-ed: Resisted IR green band 2 x 15  Resisted ER green band 2 x 15  Prone W 2 x 15  Standing resisted row black band 2 x 15; to neutral  Sidelyling shoulder abduction 1# x 10  Therapeutic Activity: Wall towel slides flexion/abduction x 10 each  Standing shoulder flexion AROM x 10  Standing shoulder scaption AROM x 10  Standing shoulder abduction AROM x 10; short level (elbow bent) Modalities: Game ready (VASO) x 10 minutes Rt shoulder low compression 34 degrees (patient prefers the Lt shoulder sleeve)    [facility] Adult PT Treatment:                                                DATE: 07/20/23 Therapeutic Exercise: Resistd tricep extension 2 x 10 @ green band   Neuromuscular re-ed: Sidelying ER 2 x 10 @ 1 lb Resisted shoulder extension green band 2 x 15  Prone T on physioball 2 x 10 Prone W on physioball 2 x 10   Bilateral shoulder  ER 2 x 10  Dead bug 2 x 10  Therapeutic Activity: Pulleys x 1 min each flexion/abduction  Inclined shoulder flexion AROM x 10  Inclined shoulder scaption AROM x 10  Sidelying shoulder abduction AROM  Modalities: Game ready x 10 minutes Rt shoulder medium compression 34 degrees   [facility] Adult PT Treatment:                                                DATE: 07/18/23  Neuromuscular re-ed: ER/IR reactive isometrics 2 x 10 green band  Sidelying ER 2 x 10  Resisted row blue band 2 x 15  Supine serratus punch x 10  Therapeutic Activity: Pulleys flexion/scaption x 1 minute each  Finger ladder flexion x 5  Finger ladder abduction x 5  Supine shoulder flexion AROM x 10  Sidelying shoulder abduction x 10  Modalities: Game ready x 10 minutes Rt shoulder medium compression 34 degrees Self Care: Discussed weaning from sling per post-operative protocol, lifting restrictions                                             PATIENT  EDUCATION: Education details: HEP update   Person educated: Patient Education method: Explanation, demo, cues, handout  Education comprehension: verbalized understanding, returned demo   HOME EXERCISE PROGRAM: Access Code: [facility] URL: [email or web address] Date: 07/25/2023 Prepared by: Forrestine Ike  Exercises - Sidelying Shoulder Abduction  - 1 x daily - 7 x weekly - 2 sets - 10 reps - Sidelying Shoulder External Rotation  - 1 x daily - 7 x weekly - 2 sets - 10 reps - Prone W Scapular Retraction  - 1 x daily - 7 x weekly - 2 sets - 10 reps - Standing Shoulder Flexion to 90 Degrees  - 1 x daily - 7 x weekly - 1 sets - 10 reps - Standing Shoulder Scaption  - 1 x daily - 7 x weekly - 1 sets - 10 reps - Standing Shoulder Row with Anchored Resistance  - 1 x daily - 7 x weekly - 2 sets - 10 reps - Shoulder Internal Rotation with Resistance  - 1 x daily - 3 x weekly - 2 sets - 10 reps - Shoulder External Rotation with Anchored Resistance  - 1 x daily - 3 x weekly - 2 sets - 10 reps  ASSESSMENT:  CLINICAL IMPRESSION: Patient tolerated session well today focusing on progression of Rt shoulder AROM in gravity dependent positioning. Visible shaking in RUE and patient noting fatigue with standing AROM in all planes, but no onset of pain and maintains proper form. Progressed rotator cuff strengthening with patient demonstrating good form and control without onset of pain. Shoulder ER/IR AROM is gradually improving having met this STG.   OBJECTIVE IMPAIRMENTS: decreased activity tolerance, decreased endurance, decreased knowledge of condition, decreased mobility, decreased ROM, decreased strength, increased edema, impaired UE functional use, postural dysfunction, and pain.     GOALS: Goals reviewed with patient? Yes  SHORT TERM GOALS: Target date: 08/10/2023    Patient will be independent and compliant with initial HEP.   Baseline: initial HEP issued  Goal status: MET  2.   Patient will demonstrate full Rt elbow extension AROM to improve ability to  complete reaching activity.  Baseline: see above Goal status: MET  3.  Patient will demonstrate at least 120 degrees of Rt shoulder AROM to improve ability to reach overhead.  Baseline: shoulder ROM deferred  Goal status: MET  4.  Patient will demonstrate at least 50 degrees of Rt shoulder ER/IR AROM to improve ability to complete self-care activities.  Baseline: shoulder ROM deferred  Goal status: MET    LONG TERM GOALS: Target date: 09/23/23  Patient will score </= 20 % disability on the QuickDASH (MCID is 8-15.9) to signify clinically meaningful improvement in functional abilities.   Baseline: see above Goal status: INITIAL  2.  Patient will demonstrate 5/5 Rt shoulder strength to improve ability to throw baseball.  Baseline: deferred  Goal status: INITIAL  3.  Patient will demonstrate 5/5 Rt elbow strength to improve ability to lift/carry items.  Baseline: deferred Goal status: INITIAL  4.  Patient will be able to perform strengthening/stabilization exercises in 90/90 positioning without onset of shoulder pain in order to progress to throwing activity.  Baseline: unable  Goal status: INITIAL  5. Patient will demonstrate 5/5 bilateral middle trap strength to improve postural stability.   Baseline: deferred  Goal Status: INITIAL    PLAN: PT FREQUENCY: 2x/week  PT DURATION: 12 weeks  PLANNED INTERVENTIONS: 97164- PT Re-evaluation, 97750- Physical Performance Testing, 97110-Therapeutic exercises, 97530- Therapeutic activity, [id number]- Neuromuscular re-education, 97535- Self Care, [id number]- Manual therapy, [id number]- Aquatic Therapy, [id number]- Electrical stimulation (unattended), [id number]- Electrical stimulation (manual), 97016- Vasopneumatic device, Taping, Dry Needling, Joint mobilization, Cryotherapy, and Moist heat  PLAN FOR NEXT SESSION: continue per protocol;   Azriella Mattia, PT, DPT, ATC 07/25/23 8:44  AM

## 2023-08-01 ENCOUNTER — Ambulatory Visit: Payer: Self-pay | Admitting: Rehabilitative and Restorative Service Providers"

## 2023-08-01 ENCOUNTER — Encounter: Payer: Self-pay | Admitting: Rehabilitative and Restorative Service Providers"

## 2023-08-01 DIAGNOSIS — R600 Localized edema: Secondary | ICD-10-CM

## 2023-08-01 DIAGNOSIS — M6281 Muscle weakness (generalized): Secondary | ICD-10-CM

## 2023-08-01 DIAGNOSIS — M25511 Pain in right shoulder: Secondary | ICD-10-CM | POA: Diagnosis not present

## 2023-08-01 DIAGNOSIS — M25611 Stiffness of right shoulder, not elsewhere classified: Secondary | ICD-10-CM

## 2023-08-01 NOTE — Therapy (Signed)
 OUTPATIENT PHYSICAL THERAPY UPPER EXTREMITY TREATMENT   Patient Name: Brian Holmes MRN: [id number] DOB:[birth year], 23 y.o., male Today's Date: 08/01/2023  END OF SESSION:  PT End of Session - 08/01/23 0846     Visit Number 9    Number of Visits 25    Date for PT Re-Evaluation 09/23/23    Authorization Type BCBS    PT Start Time 0846    PT Stop Time 0925    PT Time Calculation (min) 39 min    Activity Tolerance Patient tolerated treatment well    Behavior During Therapy [facility] for tasks assessed/performed          History reviewed. No pertinent past medical history. 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    PCP: Geofm Waldo HERO, MD  REFERRING PROVIDER: Prentiss Lesches, MD  REFERRING DIAG: strain of muscles and tendons of the rotator cuff of right shoulder, subsequent encounter   THERAPY DIAG:  Acute pain of right shoulder  Stiffness of right shoulder, not elsewhere classified  Muscle weakness (generalized)  Localized edema  Rationale for Evaluation and Treatment: Rehabilitation  ONSET DATE: 06/20/23  SUBJECTIVE:                                                                                                                                                                                      SUBJECTIVE STATEMENT: No changes-- exercises going well, no pain day to day.  EVAL: Patient underwent Rt shoulder surgery on 06/20/23. He has been in his sling for all activity except for showering since the surgery. 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 Patient wants to be able to play baseball for fun, but not competively. Pitches Rt-handed; Bats Lt-handed.  Hand dominance:  Ambidextrous  PERTINENT HISTORY: Rt shoulder bicep tenodesis 06/20/23 Patient underwent Rt shoulder surgery on 06/20/23. He has been in his sling for all activity except for showering since the surgery. 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 Patient wants to be able to play baseball for fun, but not competively. Pitches Rt-handed; Bats Lt-handed.  PAIN:  Are you having pain? No  PRECAUTIONS: Other: see post-op protocol  WEIGHT BEARING RESTRICTIONS: Yes WBAT RUE  FALLS:  Has patient fallen in last 6 months? 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   NEXT MD VISIT: 08/17/23  OBJECTIVE:  Note: Objective measures were completed at Evaluation unless otherwise noted. PATIENT SURVEYS :  Quick Dash 77.8% disability   COGNITION: Overall cognitive status: Within functional limits for tasks assessed     POSTURE: Rounder shoulders in sling   UPPER EXTREMITY ROM: shoulder PROM deferred  Passive ROM Right eval Left eval Right 5/27 PROM 07/11/23 Rt PROM 07/14/23 Rt PROM 07/18/23 Rt AROM 07/25/23 Rt AROM  Shoulder flexion   At 30 degrees abd: 92  Full  169   Shoulder extension         Shoulder abduction   35 130     Shoulder adduction         Shoulder internal rotation       61  Shoulder external rotation   At 30 degrees abd: 20    66  Elbow flexion Full         Elbow extension Lacking 40     0   Wrist flexion         Wrist extension         Wrist ulnar deviation         Wrist radial deviation         Wrist pronation         Wrist supination         (Blank rows = not tested)  UPPER EXTREMITY MMT: MMT Right eval Left eval  Shoulder flexion    Shoulder extension    Shoulder abduction    Shoulder adduction    Shoulder internal rotation    Shoulder external rotation    Middle trapezius    Lower trapezius    Elbow flexion     Elbow extension    Wrist flexion    Wrist extension    Wrist ulnar deviation    Wrist radial deviation    Wrist pronation    Wrist supination    Grip strength (lbs)    (Blank rows = not tested) RUE MMT deferred due to post-op acuity   PALPATION:  Not assessed   [facility] Adult PT Treatment:                                                DATE: 08/01/23 Therapeutic Exercise: Pulleys x 1 minute warm up  Prone  Ws x 10 reps x 2 sets T x 10 reps x 2 sets Rowing x 1# R side 10 reps x 2 sets Standing Resisted green TB ER and IR x 15 reps x 2 sets Isometrics IR/ER at wall x 10 reps x 5 second hold Scapular depression x 15 reps x 2 sets Neuromuscular re-ed: Standing ER x 15 reps x 2 sets with arm supported on physioball at 80 degrees AB and working on mechanics of stabilizing shoulder blade without rotation to right side Scapular depression reaching to floor x 15 reps x 2 sets Sidelying  ER x 1 # x 15 reps x 2 sets Abduction 1# x 15 reps x 2 sets Therapeutic Activity: Standing scaption AROM x 10 reps (if [place] goes >90, he kicks in R upper trap) Standing bent arm abduction x 10 reps    [facility] Adult PT Treatment:  DATE: 07/25/23 Neuromuscular re-ed: Resisted IR green band 2 x 15  Resisted ER green band 2 x 15  Prone W 2 x 15  Standing resisted row black band 2 x 15; to neutral  Sidelyling shoulder abduction 1# x 10  Therapeutic Activity: Wall towel slides flexion/abduction x 10 each  Standing shoulder flexion AROM x 10  Standing shoulder scaption AROM x 10  Standing shoulder abduction AROM x 10; short level (elbow bent) Modalities: Game ready (VASO) x 10 minutes Rt shoulder low compression 34 degrees (patient prefers the Lt shoulder sleeve)    [facility] Adult PT Treatment:                                                DATE: 07/20/23 Therapeutic Exercise: Resistd tricep extension 2 x 10 @ green band  Neuromuscular re-ed: Sidelying ER 2 x  10 @ 1 lb Resisted shoulder extension green band 2 x 15  Prone T on physioball 2 x 10 Prone W on physioball 2 x 10   Bilateral shoulder ER 2 x 10  Dead bug 2 x 10  Therapeutic Activity: Pulleys x 1 min each flexion/abduction  Inclined shoulder flexion AROM x 10  Inclined shoulder scaption AROM x 10  Sidelying shoulder abduction AROM  Modalities: Game ready x 10 minutes Rt shoulder medium compression 34 degrees   [facility] Adult PT Treatment:                                                DATE: 07/18/23  Neuromuscular re-ed: ER/IR reactive isometrics 2 x 10 green band  Sidelying ER 2 x 10  Resisted row blue band 2 x 15  Supine serratus punch x 10  Therapeutic Activity: Pulleys flexion/scaption x 1 minute each  Finger ladder flexion x 5  Finger ladder abduction x 5  Supine shoulder flexion AROM x 10  Sidelying shoulder abduction x 10  Modalities: Game ready x 10 minutes Rt shoulder medium compression 34 degrees Self Care: Discussed weaning from sling per post-operative protocol, lifting restrictions     PATIENT EDUCATION: Education details: HEP update   Person educated: Patient Education method: Explanation, demo, cues, handout  Education comprehension: verbalized understanding, returned demo   HOME EXERCISE PROGRAM: Access Code: [facility] URL: [email or web address] Date: 08/01/2023 Prepared by: Tawni Ferrier  Exercises - Sidelying Shoulder Abduction  - 1 x daily - 7 x weekly - 2 sets - 10 reps - Sidelying Shoulder External Rotation  - 1 x daily - 7 x weekly - 2 sets - 10 reps - Prone W Scapular Retraction  - 1 x daily - 5 x weekly - 2 sets - 15 reps - Prone Shoulder Horizontal Abduction with Thumbs Up  - 1 x daily - 5 x weekly - 2 sets - 15 reps - Standing Shoulder Flexion to 90 Degrees  - 1 x daily - 7 x weekly - 1 sets - 10 reps - Standing Shoulder Scaption  - 1 x daily - 7 x weekly - 1 sets - 10 reps - Standing Shoulder Row with Anchored Resistance  -  1 x daily - 7 x weekly - 2 sets - 10 reps - Shoulder Internal Rotation with Resistance  - 1 x  daily - 3 x weekly - 2 sets - 10 reps - Shoulder External Rotation with Anchored Resistance  - 1 x daily - 3 x weekly - 2 sets - 10 reps  ASSESSMENT:  CLINICAL IMPRESSION: The patient is at 6 weeks post op today. He is tolerating progressive strengthening well. The patient was also now able to tolerate isometrics for ER and IR (which he states was painful at week 2). He does demonstrate R shoulder elevation with scaption >90 so stopped at 90 degrees and also worked on scapular depression. Patient tolerated addition of prone horizontal abduction without pain (added to home). Plan to continue working to [employer] and LTGs.   OBJECTIVE IMPAIRMENTS: decreased activity tolerance, decreased endurance, decreased knowledge of condition, decreased mobility, decreased ROM, decreased strength, increased edema, impaired UE functional use, postural dysfunction, and pain.    GOALS: Goals reviewed with patient? Yes  SHORT TERM GOALS: Target date: 08/10/2023   Patient will be independent and compliant with initial HEP.  Baseline: initial HEP issued  Goal status: MET  2.  Patient will demonstrate full Rt elbow extension AROM to improve ability to complete reaching activity.  Baseline: see above Goal status: MET  3.  Patient will demonstrate at least 120 degrees of Rt shoulder AROM to improve ability to reach overhead.  Baseline: shoulder ROM deferred  Goal status: MET  4.  Patient will demonstrate at least 50 degrees of Rt shoulder ER/IR AROM to improve ability to complete self-care activities.  Baseline: shoulder ROM deferred  Goal status: MET    LONG TERM GOALS: Target date: 09/23/23  Patient will score </= 20 % disability on the QuickDASH (MCID is 8-15.9) to signify clinically meaningful improvement in functional abilities.  Baseline: see above Goal status: INITIAL  2.  Patient will demonstrate 5/5 Rt  shoulder strength to improve ability to throw baseball.  Baseline: deferred  Goal status: INITIAL  3.  Patient will demonstrate 5/5 Rt elbow strength to improve ability to lift/carry items.  Baseline: deferred Goal status: INITIAL  4.  Patient will be able to perform strengthening/stabilization exercises in 90/90 positioning without onset of shoulder pain in order to progress to throwing activity.  Baseline: unable  Goal status: INITIAL  5. Patient will demonstrate 5/5 bilateral middle trap strength to improve postural stability.   Baseline: deferred  Goal Status: INITIAL   PLAN: PT FREQUENCY: 2x/week  PT DURATION: 12 weeks  PLANNED INTERVENTIONS: 97164- PT Re-evaluation, 97750- Physical Performance Testing, 97110-Therapeutic exercises, 97530- Therapeutic activity, [id number]- Neuromuscular re-education, 97535- Self Care, [id number]- Manual therapy, [id number]- Aquatic Therapy, H9716- Electrical stimulation (unattended), [id number]- Electrical stimulation (manual), 97016- Vasopneumatic device, Taping, Dry Needling, Joint mobilization, Cryotherapy, and Moist heat  PLAN FOR NEXT SESSION: continue per protocol;   Denali Becvar, PT 08/01/23 9:31 AM

## 2023-08-07 ENCOUNTER — Ambulatory Visit

## 2023-08-10 ENCOUNTER — Ambulatory Visit: Attending: Family Medicine

## 2023-08-10 DIAGNOSIS — M25511 Pain in right shoulder: Secondary | ICD-10-CM | POA: Insufficient documentation

## 2023-08-10 DIAGNOSIS — M25611 Stiffness of right shoulder, not elsewhere classified: Secondary | ICD-10-CM | POA: Diagnosis present

## 2023-08-10 DIAGNOSIS — M6281 Muscle weakness (generalized): Secondary | ICD-10-CM | POA: Diagnosis present

## 2023-08-10 DIAGNOSIS — R600 Localized edema: Secondary | ICD-10-CM | POA: Insufficient documentation

## 2023-08-10 NOTE — Therapy (Signed)
 OUTPATIENT PHYSICAL THERAPY UPPER EXTREMITY TREATMENT   Patient Name: Brian Holmes MRN: [id number] DOB:Dec 31, 2000, 23 y.o., male Today's Date: 08/10/2023  END OF SESSION:  PT End of Session - 08/10/23 0759     Visit Number 10    Number of Visits 25    Date for PT Re-Evaluation 09/23/23    Authorization Type BCBS    PT Start Time 0759    PT Stop Time 0852    PT Time Calculation (min) 53 min    Activity Tolerance Patient tolerated treatment well    Behavior During Therapy [facility] for tasks assessed/performed           History reviewed. No pertinent past medical history. 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    PCP: Geofm Waldo HERO, MD  REFERRING PROVIDER: Prentiss Lesches, MD  REFERRING DIAG: strain of muscles and tendons of the rotator cuff of right shoulder, subsequent encounter   THERAPY DIAG:  Acute pain of right shoulder  Stiffness of right shoulder, not elsewhere classified  Muscle weakness (generalized)  Localized edema  Rationale for Evaluation and Treatment: Rehabilitation  ONSET DATE: 06/20/23  SUBJECTIVE:                                                                                                                                                                                      SUBJECTIVE STATEMENT: Feels good. He has started running without pain.   EVAL: Patient underwent Rt shoulder surgery on 06/20/23. He has been in his sling for all activity except for showering since the surgery. 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 Patient wants to be able to play baseball for fun, but not competively. Pitches Rt-handed; Bats Lt-handed.  Hand dominance:  Ambidextrous  PERTINENT HISTORY: Rt shoulder bicep tenodesis 06/20/23 Patient underwent Rt shoulder surgery on 06/20/23. He has been in his sling for all activity except for showering since the surgery. 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 Patient wants to be able to play baseball for fun, but not competively. Pitches Rt-handed; Bats Lt-handed.  PAIN:  Are you having pain? No  PRECAUTIONS: Other: see post-op protocol  WEIGHT BEARING RESTRICTIONS: Yes WBAT RUE  FALLS:  Has patient fallen in last 6 months? 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   NEXT MD VISIT: 08/17/23  OBJECTIVE:  Note: Objective measures were completed at Evaluation unless otherwise noted. PATIENT SURVEYS :  Quick Dash 77.8% disability   COGNITION: Overall cognitive status: Within functional limits for tasks assessed     POSTURE: Rounder shoulders in sling   UPPER EXTREMITY ROM: shoulder PROM deferred  Passive ROM Right eval Left eval Right 5/27 PROM 07/11/23 Rt PROM 07/14/23 Rt PROM 07/18/23 Rt AROM 07/25/23 Rt AROM 08/10/23 Rt AROM  Shoulder flexion   At 30 degrees abd: 92  Full  169  Full   Shoulder extension          Shoulder abduction   35 130    155  Shoulder adduction          Shoulder internal rotation       61 70  Shoulder external rotation   At 30 degrees abd: 20    66 91  Elbow flexion Full          Elbow extension Lacking 40     0    Wrist flexion          Wrist extension          Wrist ulnar deviation          Wrist radial deviation          Wrist pronation          Wrist supination          (Blank rows = not tested)  UPPER EXTREMITY MMT: MMT Right eval Left eval  Shoulder flexion    Shoulder extension    Shoulder abduction    Shoulder adduction    Shoulder internal rotation    Shoulder external rotation    Middle  trapezius    Lower trapezius    Elbow flexion    Elbow extension    Wrist flexion    Wrist extension    Wrist ulnar deviation    Wrist radial deviation    Wrist pronation    Wrist supination    Grip strength (lbs)    (Blank rows = not tested) RUE MMT deferred due to post-op acuity   PALPATION:  Not assessed  [facility] Adult PT Treatment:                                                DATE: 08/10/23 Therapeutic Exercise: Resisted shoulder adduction green band 2 x 10  Seated bilateral shoulder ER red band 2 x 15   Neuromuscular re-ed: Sidelying ER 2 x 15 @ 2 lbs  Sidelying shoulder abduction 2 x 15 @ 2 lbs  Supine serratus punch 2 x 15 @ 5 lbs Dead bug 2 lbs 2 x 10  Prone single arm Y 2 x 10   Therapeutic Activity: Finger ladder flexion and abduction x 5 each  Inclined shoulder flexion AROM x 10  Standing shoulder abduction 2 x 10  Modalities: Game ready (VASO) x 10 minutes Rt shoulder medium compression 34 degrees (patient prefers the Lt shoulder sleeve)    [facility] Adult PT Treatment:  DATE: 08/01/23 Therapeutic Exercise: Pulleys x 1 minute warm up  Prone  Ws x 10 reps x 2 sets T x 10 reps x 2 sets Rowing x 1# R side 10 reps x 2 sets Standing Resisted green TB ER and IR x 15 reps x 2 sets Isometrics IR/ER at wall x 10 reps x 5 second hold Scapular depression x 15 reps x 2 sets Neuromuscular re-ed: Standing ER x 15 reps x 2 sets with arm supported on physioball at 80 degrees AB and working on mechanics of stabilizing shoulder blade without rotation to right side Scapular depression reaching to floor x 15 reps x 2 sets Sidelying  ER x 1 # x 15 reps x 2 sets Abduction 1# x 15 reps x 2 sets Therapeutic Activity: Standing scaption AROM x 10 reps (if Challen goes >90, he kicks in R upper trap) Standing bent arm abduction x 10 reps    [facility] Adult PT Treatment:                                                DATE: 07/25/23 Neuromuscular  re-ed: Resisted IR green band 2 x 15  Resisted ER green band 2 x 15  Prone W 2 x 15  Standing resisted row black band 2 x 15; to neutral  Sidelyling shoulder abduction 1# x 10  Therapeutic Activity: Wall towel slides flexion/abduction x 10 each  Standing shoulder flexion AROM x 10  Standing shoulder scaption AROM x 10  Standing shoulder abduction AROM x 10; short level (elbow bent) Modalities: Game ready (VASO) x 10 minutes Rt shoulder low compression 34 degrees (patient prefers the Lt shoulder sleeve)    [facility] Adult PT Treatment:                                                DATE: 07/20/23 Therapeutic Exercise: Resistd tricep extension 2 x 10 @ green band  Neuromuscular re-ed: Sidelying ER 2 x 10 @ 1 lb Resisted shoulder extension green band 2 x 15  Prone T on physioball 2 x 10 Prone W on physioball 2 x 10   Bilateral shoulder ER 2 x 10  Dead bug 2 x 10  Therapeutic Activity: Pulleys x 1 min each flexion/abduction  Inclined shoulder flexion AROM x 10  Inclined shoulder scaption AROM x 10  Sidelying shoulder abduction AROM  Modalities: Game ready x 10 minutes Rt shoulder medium compression 34 degrees   PATIENT EDUCATION: Education details: HEP update   Person educated: Patient Education method: Programmer, multimedia, demo, cues, handout  Education comprehension: verbalized understanding, returned demo   HOME EXERCISE PROGRAM: Access Code: [facility] URL: [email or web address] Date: 08/10/2023 Prepared by: Lucie Meeter  Exercises - Sidelying Shoulder Abduction  - 1 x daily - 7 x weekly - 2 sets - 10 reps - Sidelying Shoulder External Rotation  - 1 x daily - 7 x weekly - 2 sets - 10 reps - Prone W Scapular Retraction  - 1 x daily - 5 x weekly - 2 sets - 15 reps - Prone Shoulder Horizontal Abduction with Thumbs Up  - 1 x daily - 5 x weekly - 2 sets - 15 reps - Standing Shoulder Flexion to 90 Degrees  -  1 x daily - 7 x weekly - 1 sets - 10 reps - Standing Shoulder  Scaption  - 1 x daily - 7 x weekly - 1 sets - 10 reps - Standing Shoulder Row with Anchored Resistance  - 1 x daily - 7 x weekly - 2 sets - 10 reps - Shoulder Internal Rotation with Resistance  - 1 x daily - 3 x weekly - 2 sets - 10 reps - Shoulder External Rotation with Anchored Resistance  - 1 x daily - 3 x weekly - 2 sets - 10 reps - Shoulder External Rotation and Scapular Retraction with Resistance  - 1 x daily - 7 x weekly - 2 sets - 10 reps  ASSESSMENT:  CLINICAL IMPRESSION: Rt shoulder AROM continues to improve with full flexion and ER noted. Mild limitation remaining in abduction and IR. Continued with shoulder and periscapular strength progression without onset of pain. Requires intermittent cues to reduce excessive upper trap engagement with periscapular strength progression.   OBJECTIVE IMPAIRMENTS: decreased activity tolerance, decreased endurance, decreased knowledge of condition, decreased mobility, decreased ROM, decreased strength, increased edema, impaired UE functional use, postural dysfunction, and pain.    GOALS: Goals reviewed with patient? Yes  SHORT TERM GOALS: Target date: 08/10/2023   Patient will be independent and compliant with initial HEP.  Baseline: initial HEP issued  Goal status: MET  2.  Patient will demonstrate full Rt elbow extension AROM to improve ability to complete reaching activity.  Baseline: see above Goal status: MET  3.  Patient will demonstrate at least 120 degrees of Rt shoulder AROM to improve ability to reach overhead.  Baseline: shoulder ROM deferred  Goal status: MET  4.  Patient will demonstrate at least 50 degrees of Rt shoulder ER/IR AROM to improve ability to complete self-care activities.  Baseline: shoulder ROM deferred  Goal status: MET    LONG TERM GOALS: Target date: 09/23/23  Patient will score </= 20 % disability on the QuickDASH (MCID is 8-15.9) to signify clinically meaningful improvement in functional abilities.   Baseline: see above Goal status: INITIAL  2.  Patient will demonstrate 5/5 Rt shoulder strength to improve ability to throw baseball.  Baseline: deferred  Goal status: INITIAL  3.  Patient will demonstrate 5/5 Rt elbow strength to improve ability to lift/carry items.  Baseline: deferred Goal status: INITIAL  4.  Patient will be able to perform strengthening/stabilization exercises in 90/90 positioning without onset of shoulder pain in order to progress to throwing activity.  Baseline: unable  Goal status: INITIAL  5. Patient will demonstrate 5/5 bilateral middle trap strength to improve postural stability.   Baseline: deferred  Goal Status: INITIAL   PLAN: PT FREQUENCY: 2x/week  PT DURATION: 12 weeks  PLANNED INTERVENTIONS: 97164- PT Re-evaluation, 97750- Physical Performance Testing, 97110-Therapeutic exercises, 97530- Therapeutic activity, [id number]- Neuromuscular re-education, 97535- Self Care, [id number]- Manual therapy, [id number]- Aquatic Therapy, H9716- Electrical stimulation (unattended), [id number]- Electrical stimulation (manual), 97016- Vasopneumatic device, Taping, Dry Needling, Joint mobilization, Cryotherapy, and Moist heat  PLAN FOR NEXT SESSION: continue per protocol;   Kaidan Spengler, PT, DPT, ATC 08/10/23 8:44 AM

## 2023-08-14 ENCOUNTER — Ambulatory Visit

## 2023-08-14 DIAGNOSIS — M25511 Pain in right shoulder: Secondary | ICD-10-CM

## 2023-08-14 DIAGNOSIS — R600 Localized edema: Secondary | ICD-10-CM

## 2023-08-14 DIAGNOSIS — M6281 Muscle weakness (generalized): Secondary | ICD-10-CM

## 2023-08-14 DIAGNOSIS — M25611 Stiffness of right shoulder, not elsewhere classified: Secondary | ICD-10-CM

## 2023-08-14 NOTE — Therapy (Signed)
 OUTPATIENT PHYSICAL THERAPY UPPER EXTREMITY TREATMENT   Patient Name: Brian Holmes MRN: [id number] DOB:04/24/2000, 23 y.o., male Today's Date: 08/14/2023  END OF SESSION:  PT End of Session - 08/14/23 0804     Visit Number 11    Number of Visits 25    Date for PT Re-Evaluation 09/23/23    Authorization Type BCBS    PT Start Time 0804    PT Stop Time 0857    PT Time Calculation (min) 53 min    Activity Tolerance Patient tolerated treatment well    Behavior During Therapy [facility] for tasks assessed/performed            History reviewed. No pertinent past medical history. 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    PCP: Geofm Waldo HERO, MD  REFERRING PROVIDER: Prentiss Lesches, MD  REFERRING DIAG: strain of muscles and tendons of the rotator cuff of right shoulder, subsequent encounter   THERAPY DIAG:  Acute pain of right shoulder  Stiffness of right shoulder, not elsewhere classified  Muscle weakness (generalized)  Localized edema  Rationale for Evaluation and Treatment: Rehabilitation  ONSET DATE: 06/20/23  SUBJECTIVE:                                                                                                                                                                                      SUBJECTIVE STATEMENT: Feels great. Has f/u with surgical PA this week.   EVAL: Patient underwent Rt shoulder surgery on 06/20/23. He has been in his sling for all activity except for showering since the surgery. 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 Patient wants to be able to play baseball for fun, but not competively. Pitches Rt-handed; Bats Lt-handed.  Hand dominance:  Ambidextrous  PERTINENT HISTORY: Rt shoulder bicep tenodesis 06/20/23 Patient underwent Rt shoulder surgery on 06/20/23. He has been in his sling for all activity except for showering since the surgery. 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 Patient wants to be able to play baseball for fun, but not competively. Pitches Rt-handed; Bats Lt-handed.  PAIN:  Are you having pain? No  PRECAUTIONS: Other: see post-op protocol  WEIGHT BEARING RESTRICTIONS: Yes WBAT RUE  FALLS:  Has patient fallen in last 6  months? 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  PATIENT GOALS: be able to get stronger, so I can workout again.   NEXT MD VISIT: 08/17/23  OBJECTIVE:  Note: Objective measures were completed at Evaluation unless otherwise noted. PATIENT SURVEYS :  Quick Dash 77.8% disability   COGNITION: Overall cognitive status: Within functional limits for tasks assessed     POSTURE: Rounder shoulders in sling   UPPER EXTREMITY ROM: shoulder PROM deferred  Passive ROM Right eval Left eval Right 5/27 PROM 07/11/23 Rt PROM 07/14/23 Rt PROM 07/18/23 Rt AROM 07/25/23 Rt AROM 08/10/23 Rt AROM  Shoulder flexion   At 30 degrees abd: 92  Full  169  Full   Shoulder extension          Shoulder abduction   35 130    155  Shoulder adduction          Shoulder internal rotation       61 70  Shoulder external rotation   At 30 degrees abd: 20    66 91  Elbow flexion Full          Elbow extension Lacking 40     0    Wrist flexion          Wrist extension          Wrist ulnar deviation          Wrist radial deviation          Wrist pronation          Wrist supination          (Blank rows = not tested)  UPPER EXTREMITY MMT: MMT Right eval Left eval  Shoulder flexion    Shoulder extension    Shoulder abduction    Shoulder adduction    Shoulder internal rotation    Shoulder external rotation    Middle  trapezius    Lower trapezius    Elbow flexion    Elbow extension    Wrist flexion    Wrist extension    Wrist ulnar deviation    Wrist radial deviation    Wrist pronation    Wrist supination    Grip strength (lbs)    (Blank rows = not tested) RUE MMT deferred due to post-op acuity   PALPATION:  Not assessed   [facility] Adult PT Treatment:                                                DATE: 08/14/23 Therapeutic Exercise: Gentle sleeper stretch 2 x 30 sec   Neuromuscular re-ed: Prone row x 2 x 15 @ 5 lbs  Prone row with ER 2 x 10  Sidelying ER rhtymic stabilization 5 x 20 sec  T on physioball 2 x 15  W on physioball 2 x 15  Serratus wall slide red band, foam roller 2 x 15  Resisted IR blue band 2 x 10  Resisted ER blue band 2 x 10  Therapeutic Activity: Finger ladder flexion/abduction x 5 each  Standing shoulder flexion AROM full range x 10  Standing shoulder scaption AROM near full x 10  Standing shoulder abduction AROM near full x 10  Modalities: Game ready x 10 minutes Rt shoulder medium compression 34 degrees   [facility] Adult PT Treatment:  DATE: 08/10/23 Therapeutic Exercise: Resisted shoulder adduction green band 2 x 10  Seated bilateral shoulder ER red band 2 x 15   Neuromuscular re-ed: Sidelying ER 2 x 15 @ 2 lbs  Sidelying shoulder abduction 2 x 15 @ 2 lbs  Supine serratus punch 2 x 15 @ 5 lbs Dead bug 2 lbs 2 x 10  Prone single arm Y 2 x 10   Therapeutic Activity: Finger ladder flexion and abduction x 5 each  Inclined shoulder flexion AROM x 10  Standing shoulder abduction 2 x 10  Modalities: Game ready (VASO) x 10 minutes Rt shoulder medium compression 34 degrees (patient prefers the Lt shoulder sleeve)    [facility] Adult PT Treatment:                                                DATE: 08/01/23 Therapeutic Exercise: Pulleys x 1 minute warm up  Prone  Ws x 10 reps x 2 sets T x 10 reps x 2 sets Rowing x 1# R side 10  reps x 2 sets Standing Resisted green TB ER and IR x 15 reps x 2 sets Isometrics IR/ER at wall x 10 reps x 5 second hold Scapular depression x 15 reps x 2 sets Neuromuscular re-ed: Standing ER x 15 reps x 2 sets with arm supported on physioball at 80 degrees AB and working on mechanics of stabilizing shoulder blade without rotation to right side Scapular depression reaching to floor x 15 reps x 2 sets Sidelying  ER x 1 # x 15 reps x 2 sets Abduction 1# x 15 reps x 2 sets Therapeutic Activity: Standing scaption AROM x 10 reps (if Sutter goes >90, he kicks in R upper trap) Standing bent arm abduction x 10 reps    [facility] Adult PT Treatment:                                                DATE: 07/25/23 Neuromuscular re-ed: Resisted IR green band 2 x 15  Resisted ER green band 2 x 15  Prone W 2 x 15  Standing resisted row black band 2 x 15; to neutral  Sidelyling shoulder abduction 1# x 10  Therapeutic Activity: Wall towel slides flexion/abduction x 10 each  Standing shoulder flexion AROM x 10  Standing shoulder scaption AROM x 10  Standing shoulder abduction AROM x 10; short level (elbow bent) Modalities: Game ready (VASO) x 10 minutes Rt shoulder low compression 34 degrees (patient prefers the Lt shoulder sleeve)    [facility] Adult PT Treatment:                                                DATE: 07/20/23 Therapeutic Exercise: Resistd tricep extension 2 x 10 @ green band  Neuromuscular re-ed: Sidelying ER 2 x 10 @ 1 lb Resisted shoulder extension green band 2 x 15  Prone T on physioball 2 x 10 Prone W on physioball 2 x 10   Bilateral shoulder ER 2 x 10  Dead bug 2 x 10  Therapeutic Activity: Pulleys x 1 min  each flexion/abduction  Inclined shoulder flexion AROM x 10  Inclined shoulder scaption AROM x 10  Sidelying shoulder abduction AROM  Modalities: Game ready x 10 minutes Rt shoulder medium compression 34 degrees   PATIENT EDUCATION: Education details: HEP review   Person  educated: Patient Education method: Programmer, multimedia,  Education comprehension: verbalized understanding,   HOME EXERCISE PROGRAM: Access Code: [employer] URL: [email or web address] Date: 08/10/2023 Prepared by: Lucie Meeter  Exercises - Sidelying Shoulder Abduction  - 1 x daily - 7 x weekly - 2 sets - 10 reps - Sidelying Shoulder External Rotation  - 1 x daily - 7 x weekly - 2 sets - 10 reps - Prone W Scapular Retraction  - 1 x daily - 5 x weekly - 2 sets - 15 reps - Prone Shoulder Horizontal Abduction with Thumbs Up  - 1 x daily - 5 x weekly - 2 sets - 15 reps - Standing Shoulder Flexion to 90 Degrees  - 1 x daily - 7 x weekly - 1 sets - 10 reps - Standing Shoulder Scaption  - 1 x daily - 7 x weekly - 1 sets - 10 reps - Standing Shoulder Row with Anchored Resistance  - 1 x daily - 7 x weekly - 2 sets - 10 reps - Shoulder Internal Rotation with Resistance  - 1 x daily - 3 x weekly - 2 sets - 10 reps - Shoulder External Rotation with Anchored Resistance  - 1 x daily - 3 x weekly - 2 sets - 10 reps - Shoulder External Rotation and Scapular Retraction with Resistance  - 1 x daily - 7 x weekly - 2 sets - 10 reps  ASSESSMENT:  CLINICAL IMPRESSION: Patient tolerated session well today focusing on progression of Rt shoulder AROM activity and periscapular strengthening. He has full standing shoulder flexion AROM without compensatory movement. Mild limitation in standing scaption and abduction AROM. Fatigues with periscapular strengthening and does require intermittent cues to reduce upper trap engagement. No reports of pain throughout session.   OBJECTIVE IMPAIRMENTS: decreased activity tolerance, decreased endurance, decreased knowledge of condition, decreased mobility, decreased ROM, decreased strength, increased edema, impaired UE functional use, postural dysfunction, and pain.    GOALS: Goals reviewed with patient? Yes  SHORT TERM GOALS: Target date: 08/10/2023   Patient will  be independent and compliant with initial HEP.  Baseline: initial HEP issued  Goal status: MET  2.  Patient will demonstrate full Rt elbow extension AROM to improve ability to complete reaching activity.  Baseline: see above Goal status: MET  3.  Patient will demonstrate at least 120 degrees of Rt shoulder AROM to improve ability to reach overhead.  Baseline: shoulder ROM deferred  Goal status: MET  4.  Patient will demonstrate at least 50 degrees of Rt shoulder ER/IR AROM to improve ability to complete self-care activities.  Baseline: shoulder ROM deferred  Goal status: MET    LONG TERM GOALS: Target date: 09/23/23  Patient will score </= 20 % disability on the QuickDASH (MCID is 8-15.9) to signify clinically meaningful improvement in functional abilities.  Baseline: see above Goal status: INITIAL  2.  Patient will demonstrate 5/5 Rt shoulder strength to improve ability to throw baseball.  Baseline: deferred  Goal status: INITIAL  3.  Patient will demonstrate 5/5 Rt elbow strength to improve ability to lift/carry items.  Baseline: deferred Goal status: INITIAL  4.  Patient will be able to perform strengthening/stabilization exercises in 90/90 positioning without onset of shoulder pain in order  to progress to throwing activity.  Baseline: unable  Goal status: INITIAL  5. Patient will demonstrate 5/5 bilateral middle trap strength to improve postural stability.   Baseline: deferred  Goal Status: INITIAL   PLAN: PT FREQUENCY: 2x/week  PT DURATION: 12 weeks  PLANNED INTERVENTIONS: 97164- PT Re-evaluation, 97750- Physical Performance Testing, 97110-Therapeutic exercises, 97530- Therapeutic activity, [id number]- Neuromuscular re-education, 97535- Self Care, 02859- Manual therapy, [id number]- Aquatic Therapy, H9716- Electrical stimulation (unattended), [id number]- Electrical stimulation (manual), 97016- Vasopneumatic device, Taping, Dry Needling, Joint mobilization, Cryotherapy, and Moist  heat  PLAN FOR NEXT SESSION: continue per protocol;   Judy Goodenow, PT, DPT, ATC 08/14/23 8:49 AM

## 2023-08-17 ENCOUNTER — Ambulatory Visit

## 2023-08-17 DIAGNOSIS — M25511 Pain in right shoulder: Secondary | ICD-10-CM

## 2023-08-17 DIAGNOSIS — R600 Localized edema: Secondary | ICD-10-CM

## 2023-08-17 DIAGNOSIS — M25611 Stiffness of right shoulder, not elsewhere classified: Secondary | ICD-10-CM

## 2023-08-17 DIAGNOSIS — M6281 Muscle weakness (generalized): Secondary | ICD-10-CM

## 2023-08-17 NOTE — Therapy (Signed)
 OUTPATIENT PHYSICAL THERAPY UPPER EXTREMITY TREATMENT   Patient Name: Brian Holmes MRN: [id number] DOB:[birth year], 23 y.o., male Today's Date: 08/17/2023  END OF SESSION:  PT End of Session - 08/17/23 0801     Visit Number 12    Number of Visits 25    Date for PT Re-Evaluation 09/23/23    Authorization Type BCBS    PT Start Time 0801    PT Stop Time 0842    PT Time Calculation (min) 41 min    Activity Tolerance Patient tolerated treatment well    Behavior During Therapy [facility] for tasks assessed/performed             History reviewed. No pertinent past medical history. 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    PCP: Geofm Waldo HERO, MD  REFERRING PROVIDER: Prentiss Lesches, MD  REFERRING DIAG: strain of muscles and tendons of the rotator cuff of right shoulder, subsequent encounter   THERAPY DIAG:  Acute pain of right shoulder  Stiffness of right shoulder, not elsewhere classified  Muscle weakness (generalized)  Localized edema  Rationale for Evaluation and Treatment: Rehabilitation  ONSET DATE: 06/20/23  SUBJECTIVE:                                                                                                                                                                                      SUBJECTIVE STATEMENT: No complaints. Has f/u with PA today.   EVAL: Patient underwent Rt shoulder surgery on 06/20/23. He has been in his sling for all activity except for showering since the surgery. 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 Patient wants to be able to play baseball for fun, but not competively. Pitches Rt-handed; Bats Lt-handed.  Hand dominance:  Ambidextrous  PERTINENT HISTORY: Rt shoulder bicep tenodesis 06/20/23 Patient underwent Rt shoulder surgery on 06/20/23. He has been in his sling for all activity except for showering since the surgery. 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 Patient wants to be able to play baseball for fun, but not competively. Pitches Rt-handed; Bats Lt-handed.  PAIN:  Are you having pain? No  PRECAUTIONS: Other: see post-op protocol  WEIGHT BEARING RESTRICTIONS: Yes WBAT RUE  FALLS:  Has patient fallen in last 6 months?  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   NEXT MD VISIT: 08/17/23  OBJECTIVE:  Note: Objective measures were completed at Evaluation unless otherwise noted. PATIENT SURVEYS :  Quick Dash 77.8% disability   COGNITION: Overall cognitive status: Within functional limits for tasks assessed     POSTURE: Rounder shoulders in sling   UPPER EXTREMITY ROM: shoulder PROM deferred  Passive ROM Right eval Left eval Right 5/27 PROM 07/11/23 Rt PROM 07/14/23 Rt PROM 07/18/23 Rt AROM 07/25/23 Rt AROM 08/10/23 Rt AROM 08/17/23 Rt AROM   Shoulder flexion   At 30 degrees abd: 92  Full  169  Full  Full   Shoulder extension           Shoulder abduction   35 130    155 175  Shoulder adduction           Shoulder internal rotation       61 70 80  Shoulder external rotation   At 30 degrees abd: 20    66 91 100  Elbow flexion Full           Elbow extension Lacking 40     0     Wrist flexion           Wrist extension           Wrist ulnar deviation           Wrist radial deviation           Wrist pronation           Wrist supination           (Blank rows = not tested)  UPPER EXTREMITY MMT: MMT Right eval Left eval  Shoulder flexion    Shoulder extension    Shoulder abduction    Shoulder adduction    Shoulder internal rotation     Shoulder external rotation    Middle trapezius    Lower trapezius    Elbow flexion    Elbow extension    Wrist flexion    Wrist extension    Wrist ulnar deviation    Wrist radial deviation    Wrist pronation    Wrist supination    Grip strength (lbs)    (Blank rows = not tested) RUE MMT deferred due to post-op acuity   PALPATION:  Not assessed  [facility] Adult PT Treatment:                                                DATE: 08/17/23 Therapeutic Exercise: Sidelying shoulder abduction 3# 2 x 10  Tricep extension black band 2 x 10   Neuromuscular re-ed: Sidelying ER 2 x 10 @ 3 lbs Shoulder wall taps x 10  Quadruped scapular protraction/retraction 2 x 10  Supine serratus punch black band 2 x 15  Supine PNF D2 pattern x 10  Supine PNF D1 pattern x 10  Therapeutic Activity: Finger ladder flexion/abduction x 5 each  Standing shoulder abduction 2 x 10 @ 1 lb  Standing shoulder flexion AROM x 10    [facility] Adult PT Treatment:  DATE: 08/14/23 Therapeutic Exercise: Gentle sleeper stretch 2 x 30 sec   Neuromuscular re-ed: Prone row x 2 x 15 @ 5 lbs  Prone row with ER 2 x 10  Sidelying ER rhtymic stabilization 5 x 20 sec  T on physioball 2 x 15  W on physioball 2 x 15  Serratus wall slide red band, foam roller 2 x 15  Resisted IR blue band 2 x 10  Resisted ER blue band 2 x 10  Therapeutic Activity: Finger ladder flexion/abduction x 5 each  Standing shoulder flexion AROM full range x 10  Standing shoulder scaption AROM near full x 10  Standing shoulder abduction AROM near full x 10  Modalities: Game ready x 10 minutes Rt shoulder medium compression 34 degrees   [facility] Adult PT Treatment:                                                DATE: 08/10/23 Therapeutic Exercise: Resisted shoulder adduction green band 2 x 10  Seated bilateral shoulder ER red band 2 x 15   Neuromuscular re-ed: Sidelying ER 2 x 15 @ 2 lbs  Sidelying shoulder  abduction 2 x 15 @ 2 lbs  Supine serratus punch 2 x 15 @ 5 lbs Dead bug 2 lbs 2 x 10  Prone single arm Y 2 x 10   Therapeutic Activity: Finger ladder flexion and abduction x 5 each  Inclined shoulder flexion AROM x 10  Standing shoulder abduction 2 x 10  Modalities: Game ready (VASO) x 10 minutes Rt shoulder medium compression 34 degrees (patient prefers the Lt shoulder sleeve)     PATIENT EDUCATION: Education details: HEP update   Person educated: Patient Education method: Explanation,  Education comprehension: verbalized understanding,   HOME EXERCISE PROGRAM: Access Code: [facility] URL: [email or web address] Date: 08/17/2023 Prepared by: Lucie Meeter  Exercises - Prone W Scapular Retraction  - 1 x daily - 3 x weekly - 2 sets - 15 reps - Prone Shoulder Horizontal Abduction with Thumbs Up  - 1 x daily - 3 x weekly - 2 sets - 15 reps - Standing Shoulder Flexion to 90 Degrees  - 1 x daily - 7 x weekly - 1 sets - 10 reps - Standing Shoulder Scaption  - 1 x daily - 7 x weekly - 1 sets - 10 reps - Standing Shoulder Row with Anchored Resistance  - 1 x daily - 7 x weekly - 2 sets - 10 reps - Sidelying Shoulder External Rotation  - 1 x daily - 3 x weekly - 2 sets - 10 reps - Shoulder Internal Rotation with Resistance  - 1 x daily - 3 x weekly - 2 sets - 10 reps - Shoulder External Rotation with Anchored Resistance  - 1 x daily - 3 x weekly - 2 sets - 10 reps - Sidelying Shoulder Abduction  - 1 x daily - 3 x weekly - 2 sets - 10 reps - Shoulder External Rotation and Scapular Retraction with Resistance  - 1 x daily - 3 x weekly - 2 sets - 10 reps - Supine Serratus Punches Resistance  - 1 x daily - 7 x weekly - 2 sets - 10 reps - Standing Elbow Extension with Self-Anchored Resistance  - 1 x daily - 7 x weekly - 2 sets - 10 reps  ASSESSMENT:  CLINICAL IMPRESSION:  Patient continues to make steady progress in PT at 8 weeks post-op. He has full and pain free Rt shoulder  AROM. Continued with strength progression per protocol with good tolerance. Initiated gentle UE CKC activity with patient able to complete quadruped periscapular strengthening without complaints. With wall taps he reported mild discomfort about anterior shoulder at end of first set so this was discontinued. He demonstrates good form and control with standing shoulder AROM activities. Initiated UE PNF patterns in gravity eliminated positioning without onset of pain.   OBJECTIVE IMPAIRMENTS: decreased activity tolerance, decreased endurance, decreased knowledge of condition, decreased mobility, decreased ROM, decreased strength, increased edema, impaired UE functional use, postural dysfunction, and pain.    GOALS: Goals reviewed with patient? Yes  SHORT TERM GOALS: Target date: 08/10/2023   Patient will be independent and compliant with initial HEP.  Baseline: initial HEP issued  Goal status: MET  2.  Patient will demonstrate full Rt elbow extension AROM to improve ability to complete reaching activity.  Baseline: see above Goal status: MET  3.  Patient will demonstrate at least 120 degrees of Rt shoulder AROM to improve ability to reach overhead.  Baseline: shoulder ROM deferred  Goal status: MET  4.  Patient will demonstrate at least 50 degrees of Rt shoulder ER/IR AROM to improve ability to complete self-care activities.  Baseline: shoulder ROM deferred  Goal status: MET    LONG TERM GOALS: Target date: 09/23/23  Patient will score </= 20 % disability on the QuickDASH (MCID is 8-15.9) to signify clinically meaningful improvement in functional abilities.  Baseline: see above Goal status: INITIAL  2.  Patient will demonstrate 5/5 Rt shoulder strength to improve ability to throw baseball.  Baseline: deferred  Goal status: INITIAL  3.  Patient will demonstrate 5/5 Rt elbow strength to improve ability to lift/carry items.  Baseline: deferred Goal status: INITIAL  4.  Patient will  be able to perform strengthening/stabilization exercises in 90/90 positioning without onset of shoulder pain in order to progress to throwing activity.  Baseline: unable  Goal status: INITIAL  5. Patient will demonstrate 5/5 bilateral middle trap strength to improve postural stability.   Baseline: deferred  Goal Status: INITIAL   PLAN: PT FREQUENCY: 2x/week  PT DURATION: 12 weeks  PLANNED INTERVENTIONS: 97164- PT Re-evaluation, 97750- Physical Performance Testing, 97110-Therapeutic exercises, 97530- Therapeutic activity, [id number]- Neuromuscular re-education, 97535- Self Care, [id number]- Manual therapy, [id number]- Aquatic Therapy, H9716- Electrical stimulation (unattended), [id number]- Electrical stimulation (manual), 97016- Vasopneumatic device, Taping, Dry Needling, Joint mobilization, Cryotherapy, and Moist heat  PLAN FOR NEXT SESSION: continue per protocol;   Pahoua Schreiner, PT, DPT, ATC 08/17/23 8:43 AM

## 2023-08-22 ENCOUNTER — Ambulatory Visit

## 2023-08-22 DIAGNOSIS — M6281 Muscle weakness (generalized): Secondary | ICD-10-CM

## 2023-08-22 DIAGNOSIS — M25511 Pain in right shoulder: Secondary | ICD-10-CM

## 2023-08-22 DIAGNOSIS — R600 Localized edema: Secondary | ICD-10-CM

## 2023-08-22 DIAGNOSIS — M25611 Stiffness of right shoulder, not elsewhere classified: Secondary | ICD-10-CM

## 2023-08-22 NOTE — Therapy (Signed)
 OUTPATIENT PHYSICAL THERAPY UPPER EXTREMITY TREATMENT   Patient Name: Brian Holmes MRN: [id number] DOB:09-16-2000, 23 y.o., male Today's Date: 08/22/2023  END OF SESSION:  PT End of Session - 08/22/23 0801     Visit Number 13    Number of Visits 25    Date for PT Re-Evaluation 09/23/23    Authorization Type BCBS    PT Start Time 0801    PT Stop Time 0843    PT Time Calculation (min) 42 min    Activity Tolerance Patient tolerated treatment well    Behavior During Therapy [facility] for tasks assessed/performed              History reviewed. No pertinent past medical history. 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    PCP: Geofm Waldo HERO, MD  REFERRING PROVIDER: Prentiss Lesches, MD  REFERRING DIAG: strain of muscles and tendons of the rotator cuff of right shoulder, subsequent encounter   THERAPY DIAG:  Acute pain of right shoulder  Stiffness of right shoulder, not elsewhere classified  Muscle weakness (generalized)  Localized edema  Rationale for Evaluation and Treatment: Rehabilitation  ONSET DATE: 06/20/23  SUBJECTIVE:                                                                                                                                                                                      SUBJECTIVE STATEMENT: Patient had f/u with surgeon who told him he could start weight lifting in 8 weeks and can begin CKC strengthening to tolerance.   EVAL: Patient underwent Rt shoulder surgery on 06/20/23. He has been in his sling for all activity except for showering since the surgery. 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 Patient wants to be able to play baseball for fun,  but not competively. Pitches Rt-handed; Bats Lt-handed.  Hand dominance: Ambidextrous  PERTINENT HISTORY: Rt shoulder bicep tenodesis 06/20/23 Patient underwent Rt shoulder surgery on 06/20/23. He has been in his sling for all activity except for showering since the surgery. 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 Patient wants to be able to play baseball for fun, but not competively. Pitches Rt-handed; Bats Lt-handed.  PAIN:  Are you having pain? No  PRECAUTIONS: Other: see post-op protocol-  CKC as tolerated, weight lifting in 8 weeks 08/22/23  WEIGHT BEARING RESTRICTIONS: Yes WBAT RUE  FALLS:  Has patient fallen in last 6 months? 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   NEXT MD VISIT: 08/17/23  OBJECTIVE:  Note: Objective measures were completed at Evaluation unless otherwise noted. PATIENT SURVEYS :  Quick Dash 77.8% disability   COGNITION: Overall cognitive status: Within functional limits for tasks assessed     POSTURE: Rounder shoulders in sling   UPPER EXTREMITY ROM: shoulder PROM deferred  Passive ROM Right eval Left eval Right 5/27 PROM 07/11/23 Rt PROM 07/14/23 Rt PROM 07/18/23 Rt AROM 07/25/23 Rt AROM 08/10/23 Rt AROM 08/17/23 Rt AROM   Shoulder flexion   At 30 degrees abd: 92  Full  169  Full  Full   Shoulder extension           Shoulder abduction   35 130    155 175  Shoulder adduction           Shoulder internal rotation       61 70 80  Shoulder external rotation   At 30 degrees abd: 20    66 91 100  Elbow flexion Full           Elbow extension Lacking 40     0     Wrist flexion           Wrist extension           Wrist ulnar deviation           Wrist radial deviation           Wrist pronation           Wrist supination           (Blank rows = not tested)  UPPER EXTREMITY MMT: MMT Right eval  Left eval  Shoulder flexion    Shoulder extension    Shoulder abduction    Shoulder adduction    Shoulder internal rotation    Shoulder external rotation    Middle trapezius    Lower trapezius    Elbow flexion    Elbow extension    Wrist flexion    Wrist extension    Wrist ulnar deviation    Wrist radial deviation    Wrist pronation    Wrist supination    Grip strength (lbs)    (Blank rows = not tested) RUE MMT deferred due to post-op acuity   PALPATION:  Not assessed  [facility] Adult PT Treatment:                                                DATE: 08/22/23 Therapeutic Exercise: Reactive isometrics shoulder flexion and extension blue band 2 x 10  Tricep kickback 2 x 15 @ 5 lbs   Neuromuscular re-ed: Shoulder wall taps 2 x 10 Resisted shoulder extension blue band 2 x 15  Resisted horizontal shoulder abduction green band 2 x 15  Sidelying ER 2 x 15 @ 2 lbs  Supine PNF D1 and D2 pattern x 10 each  Upright row 2 lbs 2 x 10  Quadruped arm reach 2 x 10  Therapeutic Activity: Standing shoulder flexion with physioball 2 x 10  Standing shoulder abduction 2 x  10 @ 2 lb    [facility] Adult PT Treatment:                                                DATE: 08/17/23 Therapeutic Exercise: Sidelying shoulder abduction 3# 2 x 10  Tricep extension black band 2 x 10   Neuromuscular re-ed: Sidelying ER 2 x 10 @ 3 lbs Shoulder wall taps x 10  Quadruped scapular protraction/retraction 2 x 10  Supine serratus punch black band 2 x 15  Supine PNF D2 pattern x 10  Supine PNF D1 pattern x 10  Therapeutic Activity: Finger ladder flexion/abduction x 5 each  Standing shoulder abduction 2 x 10 @ 1 lb  Standing shoulder flexion AROM x 10    [facility] Adult PT Treatment:                                                DATE: 08/14/23 Therapeutic Exercise: Gentle sleeper stretch 2 x 30 sec   Neuromuscular re-ed: Prone row x 2 x 15 @ 5 lbs  Prone row with ER 2 x 10  Sidelying ER rhtymic stabilization 5 x  20 sec  T on physioball 2 x 15  W on physioball 2 x 15  Serratus wall slide red band, foam roller 2 x 15  Resisted IR blue band 2 x 10  Resisted ER blue band 2 x 10  Therapeutic Activity: Finger ladder flexion/abduction x 5 each  Standing shoulder flexion AROM full range x 10  Standing shoulder scaption AROM near full x 10  Standing shoulder abduction AROM near full x 10  Modalities: Game ready x 10 minutes Rt shoulder medium compression 34 degrees    PATIENT EDUCATION: Education details: HEP update   Person educated: Patient Education method: Explanation,  Education comprehension: verbalized understanding,   HOME EXERCISE PROGRAM: Access Code: [facility] URL: [email or web address] Date: 08/22/2023 Prepared by: Lucie Meeter  Exercises - Prone W Scapular Retraction  - 1 x daily - 3 x weekly - 2 sets - 15 reps - Prone Shoulder Horizontal Abduction with Thumbs Up  - 1 x daily - 3 x weekly - 2 sets - 15 reps - Standing Shoulder Flexion to 90 Degrees  - 1 x daily - 7 x weekly - 1 sets - 10 reps - Standing Shoulder Scaption  - 1 x daily - 7 x weekly - 1 sets - 10 reps - Standing Shoulder Row with Anchored Resistance  - 1 x daily - 7 x weekly - 2 sets - 10 reps - Sidelying Shoulder External Rotation  - 1 x daily - 3 x weekly - 2 sets - 10 reps - Shoulder Internal Rotation with Resistance  - 1 x daily - 3 x weekly - 2 sets - 10 reps - Shoulder External Rotation with Anchored Resistance  - 1 x daily - 3 x weekly - 2 sets - 10 reps - Sidelying Shoulder Abduction  - 1 x daily - 3 x weekly - 2 sets - 10 reps - Shoulder External Rotation and Scapular Retraction with Resistance  - 1 x daily - 3 x weekly - 2 sets - 10 reps - Supine Serratus Punches Resistance  - 1 x daily - 7  x weekly - 2 sets - 10 reps - Standing Elbow Extension with Self-Anchored Resistance  - 1 x daily - 7 x weekly - 2 sets - 10 reps - Shoulder Extension Reactive Isometrics with Elbow Extended  - 1 x daily -  7 x weekly - 2 sets - 10 reps - Standing Shoulder Flexion Reactive Isometrics with Elbow Extended  - 1 x daily - 7 x weekly - 2 sets - 10 reps  ASSESSMENT:  CLINICAL IMPRESSION: Patient is 9 weeks post-op, so introduced bicep isometrics per post-operative protocol with good tolerance. Progressed resistance with periscapular strengthening with patient requiring intermittent cues to reduce upper trap engagement and thoracic extension. No reports of shoulder pain throughout session.   OBJECTIVE IMPAIRMENTS: decreased activity tolerance, decreased endurance, decreased knowledge of condition, decreased mobility, decreased ROM, decreased strength, increased edema, impaired UE functional use, postural dysfunction, and pain.    GOALS: Goals reviewed with patient? Yes  SHORT TERM GOALS: Target date: 08/10/2023   Patient will be independent and compliant with initial HEP.  Baseline: initial HEP issued  Goal status: MET  2.  Patient will demonstrate full Rt elbow extension AROM to improve ability to complete reaching activity.  Baseline: see above Goal status: MET  3.  Patient will demonstrate at least 120 degrees of Rt shoulder AROM to improve ability to reach overhead.  Baseline: shoulder ROM deferred  Goal status: MET  4.  Patient will demonstrate at least 50 degrees of Rt shoulder ER/IR AROM to improve ability to complete self-care activities.  Baseline: shoulder ROM deferred  Goal status: MET    LONG TERM GOALS: Target date: 09/23/23  Patient will score </= 20 % disability on the QuickDASH (MCID is 8-15.9) to signify clinically meaningful improvement in functional abilities.  Baseline: see above Goal status: INITIAL  2.  Patient will demonstrate 5/5 Rt shoulder strength to improve ability to throw baseball.  Baseline: deferred  Goal status: INITIAL  3.  Patient will demonstrate 5/5 Rt elbow strength to improve ability to lift/carry items.  Baseline: deferred Goal status:  INITIAL  4.  Patient will be able to perform strengthening/stabilization exercises in 90/90 positioning without onset of shoulder pain in order to progress to throwing activity.  Baseline: unable  Goal status: INITIAL  5. Patient will demonstrate 5/5 bilateral middle trap strength to improve postural stability.   Baseline: deferred  Goal Status: INITIAL   PLAN: PT FREQUENCY: 2x/week  PT DURATION: 12 weeks  PLANNED INTERVENTIONS: 97164- PT Re-evaluation, 97750- Physical Performance Testing, 97110-Therapeutic exercises, 97530- Therapeutic activity, [id number]- Neuromuscular re-education, 97535- Self Care, 02859- Manual therapy, [id number]- Aquatic Therapy, H9716- Electrical stimulation (unattended), [id number]- Electrical stimulation (manual), 97016- Vasopneumatic device, Taping, Dry Needling, Joint mobilization, Cryotherapy, and Moist heat  PLAN FOR NEXT SESSION: continue per protocol;   Malana Eberwein, PT, DPT, ATC 08/22/23 8:46 AM

## 2023-08-24 ENCOUNTER — Ambulatory Visit

## 2023-08-24 DIAGNOSIS — M6281 Muscle weakness (generalized): Secondary | ICD-10-CM

## 2023-08-24 DIAGNOSIS — R600 Localized edema: Secondary | ICD-10-CM

## 2023-08-24 DIAGNOSIS — M25511 Pain in right shoulder: Secondary | ICD-10-CM

## 2023-08-24 DIAGNOSIS — M25611 Stiffness of right shoulder, not elsewhere classified: Secondary | ICD-10-CM

## 2023-08-24 NOTE — Therapy (Signed)
 OUTPATIENT PHYSICAL THERAPY UPPER EXTREMITY TREATMENT   Patient Name: Brian Holmes MRN: [id number] DOB:[birth year], 23 y.o., male Today's Date: 08/24/2023  END OF SESSION:  PT End of Session - 08/24/23 0802     Visit Number 14    Number of Visits 25    Date for PT Re-Evaluation 09/23/23    Authorization Type BCBS    PT Start Time 0802    PT Stop Time 0842    PT Time Calculation (min) 40 min    Activity Tolerance Patient tolerated treatment well    Behavior During Therapy [facility] for tasks assessed/performed               History reviewed. No pertinent past medical history. 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    PCP: Geofm Waldo HERO, MD  REFERRING PROVIDER: Prentiss Lesches, MD  REFERRING DIAG: strain of muscles and tendons of the rotator cuff of right shoulder, subsequent encounter   THERAPY DIAG:  Acute pain of right shoulder  Stiffness of right shoulder, not elsewhere classified  Muscle weakness (generalized)  Localized edema  Rationale for Evaluation and Treatment: Rehabilitation  ONSET DATE: 06/20/23  SUBJECTIVE:                                                                                                                                                                                      SUBJECTIVE STATEMENT: Feels good.   EVAL: Patient underwent Rt shoulder surgery on 06/20/23. He has been in his sling for all activity except for showering since the surgery. 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 Patient wants to be able to play baseball for fun, but not competively. Pitches Rt-handed; Bats Lt-handed.  Hand dominance: Ambidextrous  PERTINENT HISTORY: Rt  shoulder bicep tenodesis 06/20/23 Patient underwent Rt shoulder surgery on 06/20/23. He has been in his sling for all activity except for showering since the surgery. 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 Patient wants to be able to play baseball for fun, but not competively. Pitches Rt-handed; Bats Lt-handed.  PAIN:  Are you having pain? No  PRECAUTIONS: Other: see post-op protocol- CKC as tolerated, weight lifting in 8 weeks 08/22/23  WEIGHT BEARING RESTRICTIONS: Yes WBAT RUE  FALLS:  Has  patient fallen in last 6 months? 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   NEXT MD VISIT: 08/17/23  OBJECTIVE:  Note: Objective measures were completed at Evaluation unless otherwise noted. PATIENT SURVEYS :  Quick Dash 77.8% disability   COGNITION: Overall cognitive status: Within functional limits for tasks assessed     POSTURE: Rounder shoulders in sling   UPPER EXTREMITY ROM: shoulder PROM deferred  Passive ROM Right eval Left eval Right 5/27 PROM 07/11/23 Rt PROM 07/14/23 Rt PROM 07/18/23 Rt AROM 07/25/23 Rt AROM 08/10/23 Rt AROM 08/17/23 Rt AROM   Shoulder flexion   At 30 degrees abd: 92  Full  169  Full  Full   Shoulder extension           Shoulder abduction   35 130    155 175  Shoulder adduction           Shoulder internal rotation       61 70 80  Shoulder external rotation   At 30 degrees abd: 20    66 91 100  Elbow flexion Full           Elbow extension Lacking 40     0     Wrist flexion           Wrist extension           Wrist ulnar deviation           Wrist radial deviation           Wrist pronation           Wrist supination           (Blank rows = not tested)  UPPER EXTREMITY MMT: MMT Right eval Left eval  Shoulder flexion    Shoulder extension    Shoulder abduction    Shoulder adduction    Shoulder  internal rotation    Shoulder external rotation    Middle trapezius    Lower trapezius    Elbow flexion    Elbow extension    Wrist flexion    Wrist extension    Wrist ulnar deviation    Wrist radial deviation    Wrist pronation    Wrist supination    Grip strength (lbs)    (Blank rows = not tested) RUE MMT deferred due to post-op acuity   PALPATION:  Not assessed   [facility] Adult PT Treatment:                                                DATE: 08/24/23 Therapeutic Exercise: Bicep isometrics 2 x 10; 5 sec hold  Resisted shoulder adduction green band 2 x 10  Shoulder flexion isometric hold at 90 degrees; 1 lb, 5  x 20 sec   Neuromuscular re-ed: Quadruped arm reach 2 x 10  Quadruped resisted scapular protraction/retraction black band 2 x 10  T on physioball 2 x 15; 1 lb W on physioball 2 x 15; 1 lb  Shoulder taps at plinth 2 x 10  Therapeutic Activity: Standing shoulder flexion with physioball 2 x 10  Standing scaption 2 x 10 @ 2 lb Standing shoulder abduction 2 x 10 @ 2 lb    [facility] Adult PT Treatment:  DATE: 08/22/23 Therapeutic Exercise: Reactive isometrics shoulder flexion and extension blue band 2 x 10  Tricep kickback 2 x 15 @ 5 lbs   Neuromuscular re-ed: Shoulder wall taps 2 x 10 Resisted shoulder extension blue band 2 x 15  Resisted horizontal shoulder abduction green band 2 x 15  Sidelying ER 2 x 15 @ 2 lbs  Supine PNF D1 and D2 pattern x 10 each  Upright row 2 lbs 2 x 10  Quadruped arm reach 2 x 10  Therapeutic Activity: Standing shoulder flexion with physioball 2 x 10  Standing shoulder abduction 2 x 10 @ 2 lb    [facility] Adult PT Treatment:                                                DATE: 08/17/23 Therapeutic Exercise: Sidelying shoulder abduction 3# 2 x 10  Tricep extension black band 2 x 10   Neuromuscular re-ed: Sidelying ER 2 x 10 @ 3 lbs Shoulder wall taps x 10  Quadruped scapular  protraction/retraction 2 x 10  Supine serratus punch black band 2 x 15  Supine PNF D2 pattern x 10  Supine PNF D1 pattern x 10  Therapeutic Activity: Finger ladder flexion/abduction x 5 each  Standing shoulder abduction 2 x 10 @ 1 lb  Standing shoulder flexion AROM x 10    [facility] Adult PT Treatment:                                                DATE: 08/14/23 Therapeutic Exercise: Gentle sleeper stretch 2 x 30 sec   Neuromuscular re-ed: Prone row x 2 x 15 @ 5 lbs  Prone row with ER 2 x 10  Sidelying ER rhtymic stabilization 5 x 20 sec  T on physioball 2 x 15  W on physioball 2 x 15  Serratus wall slide red band, foam roller 2 x 15  Resisted IR blue band 2 x 10  Resisted ER blue band 2 x 10  Therapeutic Activity: Finger ladder flexion/abduction x 5 each  Standing shoulder flexion AROM full range x 10  Standing shoulder scaption AROM near full x 10  Standing shoulder abduction AROM near full x 10  Modalities: Game ready x 10 minutes Rt shoulder medium compression 34 degrees    PATIENT EDUCATION: Education details: HEP review   Person educated: Patient Education method: Programmer, multimedia,  Education comprehension: verbalized understanding,   HOME EXERCISE PROGRAM: Access Code: [facility] URL: [email or web address] Date: 08/22/2023 Prepared by: Lucie Meeter  Exercises - Prone W Scapular Retraction  - 1 x daily - 3 x weekly - 2 sets - 15 reps - Prone Shoulder Horizontal Abduction with Thumbs Up  - 1 x daily - 3 x weekly - 2 sets - 15 reps - Standing Shoulder Flexion to 90 Degrees  - 1 x daily - 7 x weekly - 1 sets - 10 reps - Standing Shoulder Scaption  - 1 x daily - 7 x weekly - 1 sets - 10 reps - Standing Shoulder Row with Anchored Resistance  - 1 x daily - 7 x weekly - 2 sets - 10 reps - Sidelying Shoulder External Rotation  - 1 x daily - 3 x weekly -  2 sets - 10 reps - Shoulder Internal Rotation with Resistance  - 1 x daily - 3 x weekly - 2 sets - 10 reps -  Shoulder External Rotation with Anchored Resistance  - 1 x daily - 3 x weekly - 2 sets - 10 reps - Sidelying Shoulder Abduction  - 1 x daily - 3 x weekly - 2 sets - 10 reps - Shoulder External Rotation and Scapular Retraction with Resistance  - 1 x daily - 3 x weekly - 2 sets - 10 reps - Supine Serratus Punches Resistance  - 1 x daily - 7 x weekly - 2 sets - 10 reps - Standing Elbow Extension with Self-Anchored Resistance  - 1 x daily - 7 x weekly - 2 sets - 10 reps - Shoulder Extension Reactive Isometrics with Elbow Extended  - 1 x daily - 7 x weekly - 2 sets - 10 reps - Standing Shoulder Flexion Reactive Isometrics with Elbow Extended  - 1 x daily - 7 x weekly - 2 sets - 10 reps  ASSESSMENT:  CLINICAL IMPRESSION: Continued with light shoulder strength progression and bicep isometrics per protocol with good tolerance. Progressed prone series strengthening with light weight with patient requiring minimal cues to reduce upper trap activation. Progressed UE CKC strengthening without onset of pain.   OBJECTIVE IMPAIRMENTS: decreased activity tolerance, decreased endurance, decreased knowledge of condition, decreased mobility, decreased ROM, decreased strength, increased edema, impaired UE functional use, postural dysfunction, and pain.    GOALS: Goals reviewed with patient? Yes  SHORT TERM GOALS: Target date: 08/10/2023   Patient will be independent and compliant with initial HEP.  Baseline: initial HEP issued  Goal status: MET  2.  Patient will demonstrate full Rt elbow extension AROM to improve ability to complete reaching activity.  Baseline: see above Goal status: MET  3.  Patient will demonstrate at least 120 degrees of Rt shoulder AROM to improve ability to reach overhead.  Baseline: shoulder ROM deferred  Goal status: MET  4.  Patient will demonstrate at least 50 degrees of Rt shoulder ER/IR AROM to improve ability to complete self-care activities.  Baseline: shoulder ROM  deferred  Goal status: MET    LONG TERM GOALS: Target date: 09/23/23  Patient will score </= 20 % disability on the QuickDASH (MCID is 8-15.9) to signify clinically meaningful improvement in functional abilities.  Baseline: see above Goal status: INITIAL  2.  Patient will demonstrate 5/5 Rt shoulder strength to improve ability to throw baseball.  Baseline: deferred  Goal status: INITIAL  3.  Patient will demonstrate 5/5 Rt elbow strength to improve ability to lift/carry items.  Baseline: deferred Goal status: INITIAL  4.  Patient will be able to perform strengthening/stabilization exercises in 90/90 positioning without onset of shoulder pain in order to progress to throwing activity.  Baseline: unable  Goal status: INITIAL  5. Patient will demonstrate 5/5 bilateral middle trap strength to improve postural stability.   Baseline: deferred  Goal Status: INITIAL   PLAN: PT FREQUENCY: 2x/week  PT DURATION: 12 weeks  PLANNED INTERVENTIONS: 97164- PT Re-evaluation, 97750- Physical Performance Testing, 97110-Therapeutic exercises, 97530- Therapeutic activity, [id number]- Neuromuscular re-education, 97535- Self Care, [id number]- Manual therapy, [id number]- Aquatic Therapy, H9716- Electrical stimulation (unattended), [id number]- Electrical stimulation (manual), 97016- Vasopneumatic device, Taping, Dry Needling, Joint mobilization, Cryotherapy, and Moist heat  PLAN FOR NEXT SESSION: continue per protocol;   Evangelos Paulino, PT, DPT, ATC 08/24/23 8:43 AM

## 2023-08-28 ENCOUNTER — Ambulatory Visit

## 2023-08-28 NOTE — Therapy (Incomplete)
 OUTPATIENT PHYSICAL THERAPY UPPER EXTREMITY TREATMENT   Patient Name: Brian Holmes MRN: [id number] DOB:2000-06-01, 23 y.o., male Today's Date: 08/28/2023  END OF SESSION:         No past medical history on file. 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  Rationale for Evaluation and Treatment: Rehabilitation  ONSET DATE: 06/20/23  SUBJECTIVE:                                                                                                                                                                                      SUBJECTIVE STATEMENT: Feels good.   EVAL: Patient underwent Rt shoulder surgery on 06/20/23. He has been in his sling for all activity except for showering since the surgery. 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 Patient wants to be able to play baseball for fun, but not competively. Pitches Rt-handed; Bats Lt-handed.  Hand dominance: Ambidextrous  PERTINENT HISTORY: Rt shoulder bicep tenodesis 06/20/23 Patient underwent Rt shoulder surgery on 06/20/23. He has been in his sling for all activity except for showering since the surgery. 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 Patient wants to be able to play baseball for fun, but not competively. Pitches Rt-handed; Bats Lt-handed.  PAIN:  Are you having pain? No  PRECAUTIONS: Other: see post-op protocol- CKC as tolerated, weight  lifting in 8 weeks 08/22/23  WEIGHT BEARING RESTRICTIONS: Yes WBAT RUE  FALLS:  Has patient fallen in last 6 months? 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   NEXT MD VISIT: 08/17/23  OBJECTIVE:  Note: Objective measures were completed at Evaluation unless otherwise noted. PATIENT SURVEYS :  Quick Dash 77.8% disability   COGNITION: Overall cognitive status: Within functional limits for tasks assessed     POSTURE: Rounder shoulders in sling   UPPER EXTREMITY ROM: shoulder PROM deferred  Passive ROM Right eval Left eval Right 5/27 PROM 07/11/23 Rt PROM 07/14/23 Rt PROM  07/18/23 Rt AROM 07/25/23 Rt AROM 08/10/23 Rt AROM 08/17/23 Rt AROM   Shoulder flexion   At 30 degrees abd: 92  Full  169  Full  Full   Shoulder extension           Shoulder abduction   35 130    155 175  Shoulder adduction           Shoulder internal rotation       61 70 80  Shoulder external rotation   At 30 degrees abd: 20    66 91 100  Elbow flexion Full           Elbow extension Lacking 40     0     Wrist flexion           Wrist extension           Wrist ulnar deviation           Wrist radial deviation           Wrist pronation           Wrist supination           (Blank rows = not tested)  UPPER EXTREMITY MMT: MMT Right eval Left eval  Shoulder flexion    Shoulder extension    Shoulder abduction    Shoulder adduction    Shoulder internal rotation    Shoulder external rotation    Middle trapezius    Lower trapezius    Elbow flexion    Elbow extension    Wrist flexion    Wrist extension    Wrist ulnar deviation    Wrist radial deviation    Wrist pronation    Wrist supination    Grip strength (lbs)    (Blank rows = not tested) RUE MMT deferred due to post-op acuity   PALPATION:  Not assessed   [facility] Adult PT Treatment:                                                DATE: 08/24/23 Therapeutic Exercise: Bicep  isometrics 2 x 10; 5 sec hold  Resisted shoulder adduction green band 2 x 10  Shoulder flexion isometric hold at 90 degrees; 1 lb, 5  x 20 sec   Neuromuscular re-ed: Quadruped arm reach 2 x 10  Quadruped resisted scapular protraction/retraction black band 2 x 10  T on physioball 2 x 15; 1 lb W on physioball 2 x 15; 1 lb  Shoulder taps at plinth 2 x 10  Therapeutic Activity: Standing shoulder flexion with physioball 2 x 10  Standing scaption 2 x 10 @ 2 lb Standing shoulder abduction 2 x 10 @ 2 lb    [facility] Adult PT Treatment:                                                DATE: 08/22/23 Therapeutic Exercise: Reactive isometrics shoulder flexion and extension blue band 2 x 10  Tricep kickback 2 x 15 @ 5 lbs   Neuromuscular re-ed: Shoulder wall taps 2 x 10 Resisted shoulder extension blue band 2 x 15  Resisted horizontal shoulder abduction green band 2 x 15  Sidelying ER 2 x 15 @ 2  lbs  Supine PNF D1 and D2 pattern x 10 each  Upright row 2 lbs 2 x 10  Quadruped arm reach 2 x 10  Therapeutic Activity: Standing shoulder flexion with physioball 2 x 10  Standing shoulder abduction 2 x 10 @ 2 lb    [facility] Adult PT Treatment:                                                DATE: 08/17/23 Therapeutic Exercise: Sidelying shoulder abduction 3# 2 x 10  Tricep extension black band 2 x 10   Neuromuscular re-ed: Sidelying ER 2 x 10 @ 3 lbs Shoulder wall taps x 10  Quadruped scapular protraction/retraction 2 x 10  Supine serratus punch black band 2 x 15  Supine PNF D2 pattern x 10  Supine PNF D1 pattern x 10  Therapeutic Activity: Finger ladder flexion/abduction x 5 each  Standing shoulder abduction 2 x 10 @ 1 lb  Standing shoulder flexion AROM x 10    [facility] Adult PT Treatment:                                                DATE: 08/14/23 Therapeutic Exercise: Gentle sleeper stretch 2 x 30 sec   Neuromuscular re-ed: Prone row x 2 x 15 @ 5 lbs  Prone row with ER 2 x 10  Sidelying ER  rhtymic stabilization 5 x 20 sec  T on physioball 2 x 15  W on physioball 2 x 15  Serratus wall slide red band, foam roller 2 x 15  Resisted IR blue band 2 x 10  Resisted ER blue band 2 x 10  Therapeutic Activity: Finger ladder flexion/abduction x 5 each  Standing shoulder flexion AROM full range x 10  Standing shoulder scaption AROM near full x 10  Standing shoulder abduction AROM near full x 10  Modalities: Game ready x 10 minutes Rt shoulder medium compression 34 degrees    PATIENT EDUCATION: Education details: HEP review   Person educated: Patient Education method: Programmer, multimedia,  Education comprehension: verbalized understanding,   HOME EXERCISE PROGRAM: Access Code: [facility] URL: [email or web address] Date: 08/22/2023 Prepared by: Lucie Meeter  Exercises - Prone W Scapular Retraction  - 1 x daily - 3 x weekly - 2 sets - 15 reps - Prone Shoulder Horizontal Abduction with Thumbs Up  - 1 x daily - 3 x weekly - 2 sets - 15 reps - Standing Shoulder Flexion to 90 Degrees  - 1 x daily - 7 x weekly - 1 sets - 10 reps - Standing Shoulder Scaption  - 1 x daily - 7 x weekly - 1 sets - 10 reps - Standing Shoulder Row with Anchored Resistance  - 1 x daily - 7 x weekly - 2 sets - 10 reps - Sidelying Shoulder External Rotation  - 1 x daily - 3 x weekly - 2 sets - 10 reps - Shoulder Internal Rotation with Resistance  - 1 x daily - 3 x weekly - 2 sets - 10 reps - Shoulder External Rotation with Anchored Resistance  - 1 x daily - 3 x weekly - 2 sets - 10 reps - Sidelying Shoulder Abduction  - 1 x daily - 3 x  weekly - 2 sets - 10 reps - Shoulder External Rotation and Scapular Retraction with Resistance  - 1 x daily - 3 x weekly - 2 sets - 10 reps - Supine Serratus Punches Resistance  - 1 x daily - 7 x weekly - 2 sets - 10 reps - Standing Elbow Extension with Self-Anchored Resistance  - 1 x daily - 7 x weekly - 2 sets - 10 reps - Shoulder Extension Reactive Isometrics with  Elbow Extended  - 1 x daily - 7 x weekly - 2 sets - 10 reps - Standing Shoulder Flexion Reactive Isometrics with Elbow Extended  - 1 x daily - 7 x weekly - 2 sets - 10 reps  ASSESSMENT:  CLINICAL IMPRESSION: Continued with light shoulder strength progression and bicep isometrics per protocol with good tolerance. Progressed prone series strengthening with light weight with patient requiring minimal cues to reduce upper trap activation. Progressed UE CKC strengthening without onset of pain.   OBJECTIVE IMPAIRMENTS: decreased activity tolerance, decreased endurance, decreased knowledge of condition, decreased mobility, decreased ROM, decreased strength, increased edema, impaired UE functional use, postural dysfunction, and pain.    GOALS: Goals reviewed with patient? Yes  SHORT TERM GOALS: Target date: 08/10/2023   Patient will be independent and compliant with initial HEP.  Baseline: initial HEP issued  Goal status: MET  2.  Patient will demonstrate full Rt elbow extension AROM to improve ability to complete reaching activity.  Baseline: see above Goal status: MET  3.  Patient will demonstrate at least 120 degrees of Rt shoulder AROM to improve ability to reach overhead.  Baseline: shoulder ROM deferred  Goal status: MET  4.  Patient will demonstrate at least 50 degrees of Rt shoulder ER/IR AROM to improve ability to complete self-care activities.  Baseline: shoulder ROM deferred  Goal status: MET    LONG TERM GOALS: Target date: 09/23/23  Patient will score </= 20 % disability on the QuickDASH (MCID is 8-15.9) to signify clinically meaningful improvement in functional abilities.  Baseline: see above Goal status: INITIAL  2.  Patient will demonstrate 5/5 Rt shoulder strength to improve ability to throw baseball.  Baseline: deferred  Goal status: INITIAL  3.  Patient will demonstrate 5/5 Rt elbow strength to improve ability to lift/carry items.  Baseline: deferred Goal status:  INITIAL  4.  Patient will be able to perform strengthening/stabilization exercises in 90/90 positioning without onset of shoulder pain in order to progress to throwing activity.  Baseline: unable  Goal status: INITIAL  5. Patient will demonstrate 5/5 bilateral middle trap strength to improve postural stability.   Baseline: deferred  Goal Status: INITIAL   PLAN: PT FREQUENCY: 2x/week  PT DURATION: 12 weeks  PLANNED INTERVENTIONS: 97164- PT Re-evaluation, 97750- Physical Performance Testing, 97110-Therapeutic exercises, 97530- Therapeutic activity, [id number]- Neuromuscular re-education, 97535- Self Care, 02859- Manual therapy, [id number]- Aquatic Therapy, H9716- Electrical stimulation (unattended), [id number]- Electrical stimulation (manual), 97016- Vasopneumatic device, Taping, Dry Needling, Joint mobilization, Cryotherapy, and Moist heat  PLAN FOR NEXT SESSION: continue per protocol;   Safiya Girdler, PT, DPT, ATC 08/28/23 7:05 AM

## 2023-08-31 ENCOUNTER — Ambulatory Visit

## 2023-08-31 DIAGNOSIS — R600 Localized edema: Secondary | ICD-10-CM

## 2023-08-31 DIAGNOSIS — M25511 Pain in right shoulder: Secondary | ICD-10-CM

## 2023-08-31 DIAGNOSIS — M25611 Stiffness of right shoulder, not elsewhere classified: Secondary | ICD-10-CM

## 2023-08-31 DIAGNOSIS — M6281 Muscle weakness (generalized): Secondary | ICD-10-CM

## 2023-08-31 NOTE — Therapy (Signed)
 OUTPATIENT PHYSICAL THERAPY UPPER EXTREMITY TREATMENT   Patient Name: Brian Holmes MRN: [id number] DOB:2000/10/20, 23 y.o., male Today's Date: 08/31/2023  END OF SESSION:  PT End of Session - 08/31/23 0803     Visit Number 15    Number of Visits 25    Date for PT Re-Evaluation 09/23/23    Authorization Type BCBS    PT Start Time 0802    PT Stop Time 0842    PT Time Calculation (min) 40 min    Activity Tolerance Patient tolerated treatment well    Behavior During Therapy [facility] for tasks assessed/performed                History reviewed. No pertinent past medical history. 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    PCP: Geofm Waldo HERO, MD  REFERRING PROVIDER: Prentiss Lesches, MD  REFERRING DIAG: strain of muscles and tendons of the rotator cuff of right shoulder, subsequent encounter   THERAPY DIAG:  Acute pain of right shoulder  Stiffness of right shoulder, not elsewhere classified  Muscle weakness (generalized)  Localized edema  Rationale for Evaluation and Treatment: Rehabilitation  ONSET DATE: 06/20/23  SUBJECTIVE:                                                                                                                                                                                      SUBJECTIVE STATEMENT: Feels ok.   EVAL: Patient underwent Rt shoulder surgery on 06/20/23. He has been in his sling for all activity except for showering since the surgery. 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 Patient wants to be able to play baseball for fun, but not competively. Pitches Rt-handed; Bats Lt-handed.  Hand dominance: Ambidextrous  PERTINENT HISTORY: Rt  shoulder bicep tenodesis 06/20/23 Patient underwent Rt shoulder surgery on 06/20/23. He has been in his sling for all activity except for showering since the surgery. 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 Patient wants to be able to play baseball for fun, but not competively. Pitches Rt-handed; Bats Lt-handed.  PAIN:  Are you having pain? No  PRECAUTIONS: Other: see post-op protocol- CKC as tolerated, weight lifting in 8 weeks 08/22/23  WEIGHT BEARING RESTRICTIONS: Yes WBAT RUE  FALLS:  Has patient fallen in last 6 months? 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   NEXT MD VISIT: 08/17/23  OBJECTIVE:  Note: Objective measures were completed at Evaluation unless otherwise noted. PATIENT SURVEYS :  Quick Dash 77.8% disability   COGNITION: Overall cognitive status: Within functional limits for tasks assessed     POSTURE: Rounder shoulders in sling   UPPER EXTREMITY ROM: shoulder PROM deferred  Passive ROM Right eval Left eval Right 5/27 PROM 07/11/23 Rt PROM 07/14/23 Rt PROM 07/18/23 Rt AROM 07/25/23 Rt AROM 08/10/23 Rt AROM 08/17/23 Rt AROM   Shoulder flexion   At 30 degrees abd: 92  Full  169  Full  Full   Shoulder extension           Shoulder abduction   35 130    155 175  Shoulder adduction           Shoulder internal rotation       61 70 80  Shoulder external rotation   At 30 degrees abd: 20    66 91 100  Elbow flexion Full           Elbow extension Lacking 40     0     Wrist flexion           Wrist extension           Wrist ulnar deviation           Wrist radial deviation           Wrist pronation           Wrist supination           (Blank rows = not tested)  UPPER EXTREMITY MMT: MMT Right eval Left eval  Shoulder flexion    Shoulder extension    Shoulder abduction    Shoulder adduction    Shoulder  internal rotation    Shoulder external rotation    Middle trapezius    Lower trapezius    Elbow flexion    Elbow extension    Wrist flexion    Wrist extension    Wrist ulnar deviation    Wrist radial deviation    Wrist pronation    Wrist supination    Grip strength (lbs)    (Blank rows = not tested) RUE MMT deferred due to post-op acuity   PALPATION:  Not assessed  [facility] Adult PT Treatment:                                                DATE: 08/31/23 Therapeutic Exercise: 90/90 ER resistance 2 x 10; attempted green regressed to red band  90/90 IR resistance 2 x 10; green band   Neuromuscular re-ed: Scapular wall clock below 90 degrees 2 x 5 each, red band  Serratus wall slides 2 x 15  Planks 3 x 30 sec  Resisted chops in half kneel 2 x 10; 5 lbs  Plinth side steps with arm and leg 2 x 5 d/b  [place] twist with 6 lb medball 2 x 10  Partial turkish getup 3 lbs x 10 each    [facility] Adult PT Treatment:  DATE: 08/24/23 Therapeutic Exercise: Bicep isometrics 2 x 10; 5 sec hold  Resisted shoulder adduction green band 2 x 10  Shoulder flexion isometric hold at 90 degrees; 1 lb, 5  x 20 sec   Neuromuscular re-ed: Quadruped arm reach 2 x 10  Quadruped resisted scapular protraction/retraction black band 2 x 10  T on physioball 2 x 15; 1 lb W on physioball 2 x 15; 1 lb  Shoulder taps at plinth 2 x 10  Therapeutic Activity: Standing shoulder flexion with physioball 2 x 10  Standing scaption 2 x 10 @ 2 lb Standing shoulder abduction 2 x 10 @ 2 lb    [facility] Adult PT Treatment:                                                DATE: 08/22/23 Therapeutic Exercise: Reactive isometrics shoulder flexion and extension blue band 2 x 10  Tricep kickback 2 x 15 @ 5 lbs   Neuromuscular re-ed: Shoulder wall taps 2 x 10 Resisted shoulder extension blue band 2 x 15  Resisted horizontal shoulder abduction green band 2 x 15  Sidelying ER 2 x 15 @ 2 lbs   Supine PNF D1 and D2 pattern x 10 each  Upright row 2 lbs 2 x 10  Quadruped arm reach 2 x 10  Therapeutic Activity: Standing shoulder flexion with physioball 2 x 10  Standing shoulder abduction 2 x 10 @ 2 lb    PATIENT EDUCATION: Education details: HEP update   Person educated: Patient Education method: Explanation, demo, cues Education comprehension: verbalized understanding, returned demo  HOME EXERCISE PROGRAM: Access Code: [facility] URL: [email or web address] Date: 08/31/2023 Prepared by: Lucie Meeter  Exercises - Prone W Scapular Retraction  - 1 x daily - 3 x weekly - 2 sets - 15 reps - Prone Shoulder Horizontal Abduction with Thumbs Up  - 1 x daily - 3 x weekly - 2 sets - 15 reps - Standing Shoulder Flexion to 90 Degrees  - 1 x daily - 7 x weekly - 1 sets - 10 reps - Standing Shoulder Scaption  - 1 x daily - 7 x weekly - 1 sets - 10 reps - Standing Shoulder Row with Anchored Resistance  - 1 x daily - 7 x weekly - 2 sets - 10 reps - Sidelying Shoulder External Rotation  - 1 x daily - 3 x weekly - 2 sets - 10 reps - Shoulder Internal Rotation with Resistance  - 1 x daily - 3 x weekly - 2 sets - 10 reps - Shoulder External Rotation with Anchored Resistance  - 1 x daily - 3 x weekly - 2 sets - 10 reps - Sidelying Shoulder Abduction  - 1 x daily - 3 x weekly - 2 sets - 10 reps - Shoulder External Rotation and Scapular Retraction with Resistance  - 1 x daily - 3 x weekly - 2 sets - 10 reps - Supine Serratus Punches Resistance  - 1 x daily - 7 x weekly - 2 sets - 10 reps - Standing Elbow Extension with Self-Anchored Resistance  - 1 x daily - 7 x weekly - 2 sets - 10 reps - Shoulder Extension Reactive Isometrics with Elbow Extended  - 1 x daily - 7 x weekly - 2 sets - 10 reps - Standing Shoulder Flexion Reactive Isometrics with Elbow Extended  -  1 x daily - 7 x weekly - 2 sets - 10 reps - Standard Plank  - 1 x daily - 7 x weekly - 3 sets - 30 sec   hold  ASSESSMENT:  CLINICAL IMPRESSION: Further progression of shoulder and periscapular strength progression with good tolerance. Progressed UE CKC strengthening, introducing planks without reports of shoulder pain, though notable core weakness. Incorporated more core work with shoulder strengthening to address this deficit. Did note some shoulder discomfort with 90/90 resisted ER using green band, but no discomfort when dropped to red band.   OBJECTIVE IMPAIRMENTS: decreased activity tolerance, decreased endurance, decreased knowledge of condition, decreased mobility, decreased ROM, decreased strength, increased edema, impaired UE functional use, postural dysfunction, and pain.    GOALS: Goals reviewed with patient? Yes  SHORT TERM GOALS: Target date: 08/10/2023   Patient will be independent and compliant with initial HEP.  Baseline: initial HEP issued  Goal status: MET  2.  Patient will demonstrate full Rt elbow extension AROM to improve ability to complete reaching activity.  Baseline: see above Goal status: MET  3.  Patient will demonstrate at least 120 degrees of Rt shoulder AROM to improve ability to reach overhead.  Baseline: shoulder ROM deferred  Goal status: MET  4.  Patient will demonstrate at least 50 degrees of Rt shoulder ER/IR AROM to improve ability to complete self-care activities.  Baseline: shoulder ROM deferred  Goal status: MET    LONG TERM GOALS: Target date: 09/23/23  Patient will score </= 20 % disability on the QuickDASH (MCID is 8-15.9) to signify clinically meaningful improvement in functional abilities.  Baseline: see above Goal status: INITIAL  2.  Patient will demonstrate 5/5 Rt shoulder strength to improve ability to throw baseball.  Baseline: deferred  Goal status: INITIAL  3.  Patient will demonstrate 5/5 Rt elbow strength to improve ability to lift/carry items.  Baseline: deferred Goal status: INITIAL  4.  Patient will be able to perform  strengthening/stabilization exercises in 90/90 positioning without onset of shoulder pain in order to progress to throwing activity.  Baseline: unable  Goal status: INITIAL  5. Patient will demonstrate 5/5 bilateral middle trap strength to improve postural stability.   Baseline: deferred  Goal Status: INITIAL   PLAN: PT FREQUENCY: 2x/week  PT DURATION: 12 weeks  PLANNED INTERVENTIONS: 97164- PT Re-evaluation, 97750- Physical Performance Testing, 97110-Therapeutic exercises, 97530- Therapeutic activity, [id number]- Neuromuscular re-education, 97535- Self Care, [id number]- Manual therapy, [id number]- Aquatic Therapy, H9716- Electrical stimulation (unattended), [id number]- Electrical stimulation (manual), 97016- Vasopneumatic device, Taping, Dry Needling, Joint mobilization, Cryotherapy, and Moist heat  PLAN FOR NEXT SESSION: continue per protocol;   Oceana Walthall, PT, DPT, ATC 08/31/23 8:43 AM

## 2023-09-05 ENCOUNTER — Ambulatory Visit

## 2023-09-05 DIAGNOSIS — M25611 Stiffness of right shoulder, not elsewhere classified: Secondary | ICD-10-CM

## 2023-09-05 DIAGNOSIS — R600 Localized edema: Secondary | ICD-10-CM

## 2023-09-05 DIAGNOSIS — M25511 Pain in right shoulder: Secondary | ICD-10-CM | POA: Diagnosis not present

## 2023-09-05 DIAGNOSIS — M6281 Muscle weakness (generalized): Secondary | ICD-10-CM

## 2023-09-05 NOTE — Therapy (Signed)
 OUTPATIENT PHYSICAL THERAPY UPPER EXTREMITY TREATMENT   Patient Name: Brian Holmes MRN: [id number] DOB:July 20, 2000, 23 y.o., male Today's Date: 09/05/2023  END OF SESSION:  PT End of Session - 09/05/23 0803     Visit Number 16    Number of Visits 25    Date for PT Re-Evaluation 09/23/23    Authorization Type BCBS    PT Start Time 0801    PT Stop Time 0841    PT Time Calculation (min) 40 min    Activity Tolerance Patient tolerated treatment well    Behavior During Therapy [facility] for tasks assessed/performed                 History reviewed. No pertinent past medical history. 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    PCP: Geofm Waldo HERO, MD  REFERRING PROVIDER: Prentiss Lesches, MD  REFERRING DIAG: strain of muscles and tendons of the rotator cuff of right shoulder, subsequent encounter   THERAPY DIAG:  Acute pain of right shoulder  Stiffness of right shoulder, not elsewhere classified  Muscle weakness (generalized)  Localized edema  Rationale for Evaluation and Treatment: Rehabilitation  ONSET DATE: 06/20/23  SUBJECTIVE:                                                                                                                                                                                      SUBJECTIVE STATEMENT: Feels great.   EVAL: Patient underwent Rt shoulder surgery on 06/20/23. He has been in his sling for all activity except for showering since the surgery. 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 Patient wants to be able to play baseball for fun, but not competively. Pitches Rt-handed; Bats Lt-handed.  Hand dominance: Ambidextrous  PERTINENT  HISTORY: Rt shoulder bicep tenodesis 06/20/23 Patient underwent Rt shoulder surgery on 06/20/23. He has been in his sling for all activity except for showering since the surgery. 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 Patient wants to be able to play baseball for fun, but not competively. Pitches Rt-handed; Bats Lt-handed.  PAIN:  Are you having pain? No  PRECAUTIONS: Other: see post-op protocol- CKC as tolerated, weight lifting in 8 weeks 08/22/23  WEIGHT BEARING RESTRICTIONS: Yes WBAT RUE  FALLS:  Has patient fallen in last 6 months? 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   NEXT MD VISIT: 08/17/23  OBJECTIVE:  Note: Objective measures were completed at Evaluation unless otherwise noted. PATIENT SURVEYS :  Quick Dash 77.8% disability   COGNITION: Overall cognitive status: Within functional limits for tasks assessed     POSTURE: Rounder shoulders in sling   UPPER EXTREMITY ROM: shoulder PROM deferred  Passive ROM Right eval Left eval Right 5/27 PROM 07/11/23 Rt PROM 07/14/23 Rt PROM 07/18/23 Rt AROM 07/25/23 Rt AROM 08/10/23 Rt AROM 08/17/23 Rt AROM   Shoulder flexion   At 30 degrees abd: 92  Full  169  Full  Full   Shoulder extension           Shoulder abduction   35 130    155 175  Shoulder adduction           Shoulder internal rotation       61 70 80  Shoulder external rotation   At 30 degrees abd: 20    66 91 100  Elbow flexion Full           Elbow extension Lacking 40     0     Wrist flexion           Wrist extension           Wrist ulnar deviation           Wrist radial deviation           Wrist pronation           Wrist supination           (Blank rows = not tested)  UPPER EXTREMITY MMT: MMT Right eval Left eval  Shoulder flexion    Shoulder extension    Shoulder abduction    Shoulder adduction     Shoulder internal rotation    Shoulder external rotation    Middle trapezius    Lower trapezius    Elbow flexion    Elbow extension    Wrist flexion    Wrist extension    Wrist ulnar deviation    Wrist radial deviation    Wrist pronation    Wrist supination    Grip strength (lbs)    (Blank rows = not tested) RUE MMT deferred due to post-op acuity   PALPATION:  Not assessed  [facility] Adult PT Treatment:                                                DATE: 09/05/23  Neuromuscular re-ed: Tall plank 3 x 20 sec  Prone row with ER 2 x 15 @ 3 lbs  Wall ball circles 2 x 20 CW/CCW flexion and abduction  Shoulders walks at plinth with red theraband around wrist 3 sets d/b  Side plank 2 x 20 sec each  Renegade row modified on knees 2 x 10; 5 lbs  Resisted shoulder extension blue band 2 x 15  Medball crunch 2 x 20; 6 lb med ball  Therapeutic Activity: Standing shoulder flexion AROM with physioball x 10  Farmer's carry 3 laps in gym, 10 lb kettlebell    [facility] Adult PT Treatment:  DATE: 08/31/23 Therapeutic Exercise: 90/90 ER resistance 2 x 10; attempted green regressed to red band  90/90 IR resistance 2 x 10; green band   Neuromuscular re-ed: Scapular wall clock below 90 degrees 2 x 5 each, red band  Serratus wall slides 2 x 15  Planks 3 x 30 sec  Resisted chops in half kneel 2 x 10; 5 lbs  Plinth side steps with arm and leg 2 x 5 d/b  [place] twist with 6 lb medball 2 x 10  Partial turkish getup 3 lbs x 10 each    [facility] Adult PT Treatment:                                                DATE: 08/24/23 Therapeutic Exercise: Bicep isometrics 2 x 10; 5 sec hold  Resisted shoulder adduction green band 2 x 10  Shoulder flexion isometric hold at 90 degrees; 1 lb, 5  x 20 sec   Neuromuscular re-ed: Quadruped arm reach 2 x 10  Quadruped resisted scapular protraction/retraction black band 2 x 10  T on physioball 2 x 15; 1 lb W on physioball 2  x 15; 1 lb  Shoulder taps at plinth 2 x 10  Therapeutic Activity: Standing shoulder flexion with physioball 2 x 10  Standing scaption 2 x 10 @ 2 lb Standing shoulder abduction 2 x 10 @ 2 lb     PATIENT EDUCATION: Education details: HEP review   Person educated: Patient Education method: Explanation,  Education comprehension: verbalized understanding  HOME EXERCISE PROGRAM: Access Code: [facility] URL: [email or web address] Date: 08/31/2023 Prepared by: Lucie Meeter  Exercises - Prone W Scapular Retraction  - 1 x daily - 3 x weekly - 2 sets - 15 reps - Prone Shoulder Horizontal Abduction with Thumbs Up  - 1 x daily - 3 x weekly - 2 sets - 15 reps - Standing Shoulder Flexion to 90 Degrees  - 1 x daily - 7 x weekly - 1 sets - 10 reps - Standing Shoulder Scaption  - 1 x daily - 7 x weekly - 1 sets - 10 reps - Standing Shoulder Row with Anchored Resistance  - 1 x daily - 7 x weekly - 2 sets - 10 reps - Sidelying Shoulder External Rotation  - 1 x daily - 3 x weekly - 2 sets - 10 reps - Shoulder Internal Rotation with Resistance  - 1 x daily - 3 x weekly - 2 sets - 10 reps - Shoulder External Rotation with Anchored Resistance  - 1 x daily - 3 x weekly - 2 sets - 10 reps - Sidelying Shoulder Abduction  - 1 x daily - 3 x weekly - 2 sets - 10 reps - Shoulder External Rotation and Scapular Retraction with Resistance  - 1 x daily - 3 x weekly - 2 sets - 10 reps - Supine Serratus Punches Resistance  - 1 x daily - 7 x weekly - 2 sets - 10 reps - Standing Elbow Extension with Self-Anchored Resistance  - 1 x daily - 7 x weekly - 2 sets - 10 reps - Shoulder Extension Reactive Isometrics with Elbow Extended  - 1 x daily - 7 x weekly - 2 sets - 10 reps - Standing Shoulder Flexion Reactive Isometrics with Elbow Extended  - 1 x daily - 7 x weekly - 2 sets -  10 reps - Standard Plank  - 1 x daily - 7 x weekly - 3 sets - 30 sec  hold  ASSESSMENT:  CLINICAL IMPRESSION: Continued with  shoulder and periscapular strength progression with good tolerance. Progressed into tall plank and side plank positioning with patient demonstrating improved core stabilization and no reports of shoulder pain. Fatigues with 90/90 rotator cuff strengthening, but no onset of pain.   OBJECTIVE IMPAIRMENTS: decreased activity tolerance, decreased endurance, decreased knowledge of condition, decreased mobility, decreased ROM, decreased strength, increased edema, impaired UE functional use, postural dysfunction, and pain.    GOALS: Goals reviewed with patient? Yes  SHORT TERM GOALS: Target date: 08/10/2023   Patient will be independent and compliant with initial HEP.  Baseline: initial HEP issued  Goal status: MET  2.  Patient will demonstrate full Rt elbow extension AROM to improve ability to complete reaching activity.  Baseline: see above Goal status: MET  3.  Patient will demonstrate at least 120 degrees of Rt shoulder AROM to improve ability to reach overhead.  Baseline: shoulder ROM deferred  Goal status: MET  4.  Patient will demonstrate at least 50 degrees of Rt shoulder ER/IR AROM to improve ability to complete self-care activities.  Baseline: shoulder ROM deferred  Goal status: MET    LONG TERM GOALS: Target date: 09/23/23  Patient will score </= 20 % disability on the QuickDASH (MCID is 8-15.9) to signify clinically meaningful improvement in functional abilities.  Baseline: see above Goal status: INITIAL  2.  Patient will demonstrate 5/5 Rt shoulder strength to improve ability to throw baseball.  Baseline: deferred  Goal status: INITIAL  3.  Patient will demonstrate 5/5 Rt elbow strength to improve ability to lift/carry items.  Baseline: deferred Goal status: INITIAL  4.  Patient will be able to perform strengthening/stabilization exercises in 90/90 positioning without onset of shoulder pain in order to progress to throwing activity.  Baseline: unable  Goal status:  INITIAL  5. Patient will demonstrate 5/5 bilateral middle trap strength to improve postural stability.   Baseline: deferred  Goal Status: INITIAL   PLAN: PT FREQUENCY: 2x/week  PT DURATION: 12 weeks  PLANNED INTERVENTIONS: 97164- PT Re-evaluation, 97750- Physical Performance Testing, 97110-Therapeutic exercises, 97530- Therapeutic activity, [id number]- Neuromuscular re-education, 97535- Self Care, 02859- Manual therapy, [id number]- Aquatic Therapy, H9716- Electrical stimulation (unattended), [id number]- Electrical stimulation (manual), 97016- Vasopneumatic device, Taping, Dry Needling, Joint mobilization, Cryotherapy, and Moist heat  PLAN FOR NEXT SESSION: continue per protocol;   Kenai Fluegel, PT, DPT, ATC 09/05/23 8:42 AM

## 2023-09-07 ENCOUNTER — Ambulatory Visit

## 2023-09-07 DIAGNOSIS — M25511 Pain in right shoulder: Secondary | ICD-10-CM | POA: Diagnosis not present

## 2023-09-07 DIAGNOSIS — R600 Localized edema: Secondary | ICD-10-CM

## 2023-09-07 DIAGNOSIS — M6281 Muscle weakness (generalized): Secondary | ICD-10-CM

## 2023-09-07 DIAGNOSIS — M25611 Stiffness of right shoulder, not elsewhere classified: Secondary | ICD-10-CM

## 2023-09-07 NOTE — Therapy (Signed)
 OUTPATIENT PHYSICAL THERAPY UPPER EXTREMITY TREATMENT   Patient Name: Brian Holmes MRN: [id number] DOB:2000-07-05, 23 y.o., male Today's Date: 09/07/2023  END OF SESSION:  PT End of Session - 09/07/23 0800     Visit Number 17    Number of Visits 25    Date for PT Re-Evaluation 09/23/23    Authorization Type BCBS    PT Start Time 0800    PT Stop Time 0840    PT Time Calculation (min) 40 min    Activity Tolerance Patient tolerated treatment well    Behavior During Therapy [facility] for tasks assessed/performed                  History reviewed. No pertinent past medical history. 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    PCP: Geofm Waldo HERO, MD  REFERRING PROVIDER: Prentiss Lesches, MD  REFERRING DIAG: strain of muscles and tendons of the rotator cuff of right shoulder, subsequent encounter   THERAPY DIAG:  Acute pain of right shoulder  Stiffness of right shoulder, not elsewhere classified  Muscle weakness (generalized)  Localized edema  Rationale for Evaluation and Treatment: Rehabilitation  ONSET DATE: 06/20/23  SUBJECTIVE:                                                                                                                                                                                      SUBJECTIVE STATEMENT: Feels good.   EVAL: Patient underwent Rt shoulder surgery on 06/20/23. He has been in his sling for all activity except for showering since the surgery. 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 Patient wants to be able to play baseball for fun, but not competively. Pitches Rt-handed; Bats Lt-handed.  Hand dominance: Ambidextrous  PERTINENT  HISTORY: Rt shoulder bicep tenodesis 06/20/23 Patient underwent Rt shoulder surgery on 06/20/23. He has been in his sling for all activity except for showering since the surgery. 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 Patient wants to be able to play baseball for fun, but not competively. Pitches Rt-handed; Bats Lt-handed.  PAIN:  Are you having pain? No  PRECAUTIONS: Other: see post-op protocol- CKC as tolerated, weight lifting in 8 weeks 08/22/23  WEIGHT BEARING RESTRICTIONS: Yes WBAT RUE  FALLS:  Has patient fallen in last 6 months? 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   NEXT MD VISIT: 08/17/23  OBJECTIVE:  Note: Objective measures were completed at Evaluation unless otherwise noted. PATIENT SURVEYS :  Quick Dash 77.8% disability   COGNITION: Overall cognitive status: Within functional limits for tasks assessed     POSTURE: Rounder shoulders in sling   UPPER EXTREMITY ROM: shoulder PROM deferred  Passive ROM Right eval Left eval Right 5/27 PROM 07/11/23 Rt PROM 07/14/23 Rt PROM 07/18/23 Rt AROM 07/25/23 Rt AROM 08/10/23 Rt AROM 08/17/23 Rt AROM   Shoulder flexion   At 30 degrees abd: 92  Full  169  Full  Full   Shoulder extension           Shoulder abduction   35 130    155 175  Shoulder adduction           Shoulder internal rotation       61 70 80  Shoulder external rotation   At 30 degrees abd: 20    66 91 100  Elbow flexion Full           Elbow extension Lacking 40     0     Wrist flexion           Wrist extension           Wrist ulnar deviation           Wrist radial deviation           Wrist pronation           Wrist supination           (Blank rows = not tested)  UPPER EXTREMITY MMT: MMT Right eval Left eval  Shoulder flexion    Shoulder extension    Shoulder abduction    Shoulder adduction     Shoulder internal rotation    Shoulder external rotation    Middle trapezius    Lower trapezius    Elbow flexion    Elbow extension    Wrist flexion    Wrist extension    Wrist ulnar deviation    Wrist radial deviation    Wrist pronation    Wrist supination    Grip strength (lbs)    (Blank rows = not tested) RUE MMT deferred due to post-op acuity   PALPATION:  Not assessed  [facility] Adult PT Treatment:                                                DATE: 09/07/23 Therapeutic Exercise: Lateral raise 2 x 10; 5 lbs  Tricep kickback 2 x 10; 10 lbs  Sidelying ER 2 x 10 @ 3 lbs   Neuromuscular re-ed: Renegade row modified on knees 2 x 10; 5 lbs  Modified plank dumbbell pass 2 x 10; 5 lbs  Wall ball circles abduction 2 x 20 CW/CCW Bent over row 2 x 15 @ 10 lbs  Modified plank walk up on aerobic stepper 2 x 10  Bird dog 2 x 10  Lower trap setting with shoulder ER 2 x 15, red band  Lower trap setting at wall d/c due to pain Prone single arm Y 2 x 15  Therapeutic Activity: Farmer's  carry 15 lbs 3 laps in gym     [facility] Adult PT Treatment:                                                DATE: 09/05/23  Neuromuscular re-ed: Tall plank 3 x 20 sec  Prone row with ER 2 x 15 @ 3 lbs  Wall ball circles 2 x 20 CW/CCW flexion and abduction  Shoulders walks at plinth with red theraband around wrist 3 sets d/b  Side plank 2 x 20 sec each  Renegade row modified on knees 2 x 10; 5 lbs  Resisted shoulder extension blue band 2 x 15  Medball crunch 2 x 20; 6 lb med ball  Therapeutic Activity: Standing shoulder flexion AROM with physioball x 10  Farmer's carry 3 laps in gym, 10 lb kettlebell    [facility] Adult PT Treatment:                                                DATE: 08/31/23 Therapeutic Exercise: 90/90 ER resistance 2 x 10; attempted green regressed to red band  90/90 IR resistance 2 x 10; green band   Neuromuscular re-ed: Scapular wall clock below 90 degrees 2 x 5 each, red band   Serratus wall slides 2 x 15  Planks 3 x 30 sec  Resisted chops in half kneel 2 x 10; 5 lbs  Plinth side steps with arm and leg 2 x 5 d/b  [place] twist with 6 lb medball 2 x 10  Partial turkish getup 3 lbs x 10 each    PATIENT EDUCATION: Education details: HEP update   Person educated: Patient Education method: Programmer, multimedia, demo, cues, handout Education comprehension: verbalized understanding, returned demo  HOME EXERCISE PROGRAM: Access Code: [facility] URL: [email or web address] Date: 09/07/2023 Prepared by: Lucie Meeter  Exercises - Prone W Scapular Retraction  - 1 x daily - 3 x weekly - 2 sets - 15 reps - Prone Shoulder Horizontal Abduction with Thumbs Up  - 1 x daily - 3 x weekly - 2 sets - 15 reps - Standing Shoulder Flexion to 90 Degrees  - 1 x daily - 3 x weekly - 1 sets - 10 reps - Standing Shoulder Scaption  - 1 x daily - 3 x weekly - 1 sets - 10 reps - Standing Shoulder Row with Anchored Resistance  - 1 x daily - 3 x weekly - 2 sets - 10 reps - Sidelying Shoulder External Rotation  - 1 x daily - 3 x weekly - 2 sets - 10 reps - Shoulder Internal Rotation with Resistance  - 1 x daily - 3 x weekly - 2 sets - 10 reps - Shoulder External Rotation with Anchored Resistance  - 1 x daily - 3 x weekly - 2 sets - 10 reps - Sidelying Shoulder Abduction  - 1 x daily - 3 x weekly - 2 sets - 10 reps - Shoulder External Rotation and Scapular Retraction with Resistance  - 1 x daily - 3 x weekly - 2 sets - 10 reps - Supine Serratus Punches Resistance  - 1 x daily - 3 x weekly - 2 sets - 10 reps - Standing Elbow Extension with Self-Anchored Resistance  -  1 x daily - 3 x weekly - 2 sets - 10 reps - Shoulder Extension Reactive Isometrics with Elbow Extended  - 1 x daily - 3 x weekly - 2 sets - 10 reps - Standing Shoulder Flexion Reactive Isometrics with Elbow Extended  - 1 x daily - 3 x weekly - 2 sets - 10 reps - Standard Plank  - 1 x daily - 3 x weekly - 3 sets - 30 sec   hold - Standing Bent Over Single Arm Shoulder Row  - 1 x daily - 3 x weekly - 3 sets - 10 reps - Bird Dog  - 1 x daily - 3 x weekly - 3 sets - 10 reps - Prone Single Arm Shoulder Y  - 1 x daily - 3 x weekly - 2 sets - 15 reps  ASSESSMENT:  CLINICAL IMPRESSION: Further shoulder and periscapular strength progression performed today with good tolerance. Progressed resistance with shoulder strengthening (except bicep per protocol instructions) with visible shaking in RUE, but no reports of pain. Mild scapular winging present with UE CKC. Requires intermittent cues to reduce lumbar lordosis with CKC tasks. Did have anterior shoulder pain with lower trap setting at wall, so this was discontinued. No pain with lower trap strengthening in prone.   OBJECTIVE IMPAIRMENTS: decreased activity tolerance, decreased endurance, decreased knowledge of condition, decreased mobility, decreased ROM, decreased strength, increased edema, impaired UE functional use, postural dysfunction, and pain.    GOALS: Goals reviewed with patient? Yes  SHORT TERM GOALS: Target date: 08/10/2023   Patient will be independent and compliant with initial HEP.  Baseline: initial HEP issued  Goal status: MET  2.  Patient will demonstrate full Rt elbow extension AROM to improve ability to complete reaching activity.  Baseline: see above Goal status: MET  3.  Patient will demonstrate at least 120 degrees of Rt shoulder AROM to improve ability to reach overhead.  Baseline: shoulder ROM deferred  Goal status: MET  4.  Patient will demonstrate at least 50 degrees of Rt shoulder ER/IR AROM to improve ability to complete self-care activities.  Baseline: shoulder ROM deferred  Goal status: MET    LONG TERM GOALS: Target date: 09/23/23  Patient will score </= 20 % disability on the QuickDASH (MCID is 8-15.9) to signify clinically meaningful improvement in functional abilities.  Baseline: see above Goal status: INITIAL  2.   Patient will demonstrate 5/5 Rt shoulder strength to improve ability to throw baseball.  Baseline: deferred  Goal status: INITIAL  3.  Patient will demonstrate 5/5 Rt elbow strength to improve ability to lift/carry items.  Baseline: deferred Goal status: INITIAL  4.  Patient will be able to perform strengthening/stabilization exercises in 90/90 positioning without onset of shoulder pain in order to progress to throwing activity.  Baseline: unable  Goal status: INITIAL  5. Patient will demonstrate 5/5 bilateral middle trap strength to improve postural stability.   Baseline: deferred  Goal Status: INITIAL   PLAN: PT FREQUENCY: 2x/week  PT DURATION: 12 weeks  PLANNED INTERVENTIONS: 97164- PT Re-evaluation, 97750- Physical Performance Testing, 97110-Therapeutic exercises, 97530- Therapeutic activity, [id number]- Neuromuscular re-education, 97535- Self Care, [id number]- Manual therapy, [id number]- Aquatic Therapy, H9716- Electrical stimulation (unattended), [id number]- Electrical stimulation (manual), 97016- Vasopneumatic device, Taping, Dry Needling, Joint mobilization, Cryotherapy, and Moist heat  PLAN FOR NEXT SESSION: continue per protocol;   Shelly Shoultz, PT, DPT, ATC 09/07/23 8:43 AM

## 2023-09-12 ENCOUNTER — Encounter: Admitting: Physical Therapy

## 2023-09-14 ENCOUNTER — Ambulatory Visit: Attending: Family Medicine

## 2023-09-14 DIAGNOSIS — R600 Localized edema: Secondary | ICD-10-CM | POA: Insufficient documentation

## 2023-09-14 DIAGNOSIS — M25611 Stiffness of right shoulder, not elsewhere classified: Secondary | ICD-10-CM | POA: Diagnosis present

## 2023-09-14 DIAGNOSIS — M6281 Muscle weakness (generalized): Secondary | ICD-10-CM | POA: Insufficient documentation

## 2023-09-14 DIAGNOSIS — M25511 Pain in right shoulder: Secondary | ICD-10-CM | POA: Insufficient documentation

## 2023-09-14 NOTE — Therapy (Signed)
 OUTPATIENT PHYSICAL THERAPY UPPER EXTREMITY TREATMENT   Patient Name: Brian Holmes MRN: [id number] DOB:[birth year], 23 y.o., male Today's Date: 09/14/2023  END OF SESSION:  PT End of Session - 09/14/23 0801     Visit Number 18    Number of Visits 25    Date for PT Re-Evaluation 09/23/23    Authorization Type BCBS    PT Start Time 0801    PT Stop Time 0842    PT Time Calculation (min) 41 min    Activity Tolerance Patient tolerated treatment well    Behavior During Therapy [facility] for tasks assessed/performed                   History reviewed. No pertinent past medical history. 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    PCP: Geofm Waldo HERO, MD  REFERRING PROVIDER: Prentiss Lesches, MD  REFERRING DIAG: strain of muscles and tendons of the rotator cuff of right shoulder, subsequent encounter   THERAPY DIAG:  Acute pain of right shoulder  Stiffness of right shoulder, not elsewhere classified  Muscle weakness (generalized)  Localized edema  Rationale for Evaluation and Treatment: Rehabilitation  ONSET DATE: 06/20/23  SUBJECTIVE:                                                                                                                                                                                      SUBJECTIVE STATEMENT: No complaints.   EVAL: Patient underwent Rt shoulder surgery on 06/20/23. He has been in his sling for all activity except for showering since the surgery. 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 Patient wants to be able to play baseball for fun, but not competively. Pitches Rt-handed; Bats Lt-handed.  Hand dominance: Ambidextrous  PERTINENT  HISTORY: Rt shoulder bicep tenodesis 06/20/23 Patient underwent Rt shoulder surgery on 06/20/23. He has been in his sling for all activity except for showering since the surgery. 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 Patient wants to be able to play baseball for fun, but not competively. Pitches Rt-handed; Bats Lt-handed.  PAIN:  Are you having pain? No  PRECAUTIONS: Other: see post-op protocol- CKC as tolerated, weight lifting in 8 weeks 08/22/23  WEIGHT BEARING RESTRICTIONS: Yes WBAT RUE  FALLS:  Has patient fallen in last 6 months? 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   NEXT MD VISIT: 08/17/23  OBJECTIVE:  Note: Objective measures were completed at Evaluation unless otherwise noted. PATIENT SURVEYS :  Quick Dash 77.8% disability   COGNITION: Overall cognitive status: Within functional limits for tasks assessed     POSTURE: Rounder shoulders in sling   UPPER EXTREMITY ROM: shoulder PROM deferred  Passive ROM Right eval Left eval Right 5/27 PROM 07/11/23 Rt PROM 07/14/23 Rt PROM 07/18/23 Rt AROM 07/25/23 Rt AROM 08/10/23 Rt AROM 08/17/23 Rt AROM   Shoulder flexion   At 30 degrees abd: 92  Full  169  Full  Full   Shoulder extension           Shoulder abduction   35 130    155 175  Shoulder adduction           Shoulder internal rotation       61 70 80  Shoulder external rotation   At 30 degrees abd: 20    66 91 100  Elbow flexion Full           Elbow extension Lacking 40     0     Wrist flexion           Wrist extension           Wrist ulnar deviation           Wrist radial deviation           Wrist pronation           Wrist supination           (Blank rows = not tested)  UPPER EXTREMITY MMT: MMT Right eval Left eval  Shoulder flexion    Shoulder extension    Shoulder abduction    Shoulder adduction     Shoulder internal rotation    Shoulder external rotation    Middle trapezius    Lower trapezius    Elbow flexion    Elbow extension    Wrist flexion    Wrist extension    Wrist ulnar deviation    Wrist radial deviation    Wrist pronation    Wrist supination    Grip strength (lbs)    (Blank rows = not tested) RUE MMT deferred due to post-op acuity   PALPATION:  Not assessed  [facility] Adult PT Treatment:                                                DATE: 09/14/23  Neuromuscular re-ed: Modified push-up at wall 2 x 10  Sideplank thread the needle x 10 each  Prone single arm Y 2 x 15; 1 lb Resisted PNF D2 flexion supine green band 2 x 10  Standing ER cable 2 x 5; 5 lbs  90/90 IR blue band 2 x 15 Resisted standing lateral trunk flexion with kettlebell hold 2 x 15 @ 15 lbs  Arm circles CW/CCW x 30 sec each; 1 lb  Therapeutic Activity: Standing scaption 2 x 15 @ 3 lbs  Sled push 1 lap, 50 lbs     [facility] Adult PT Treatment:  DATE: 09/07/23 Therapeutic Exercise: Lateral raise 2 x 10; 5 lbs  Tricep kickback 2 x 10; 10 lbs  Sidelying ER 2 x 10 @ 3 lbs   Neuromuscular re-ed: Renegade row modified on knees 2 x 10; 5 lbs  Modified plank dumbbell pass 2 x 10; 5 lbs  Wall ball circles abduction 2 x 20 CW/CCW Bent over row 2 x 15 @ 10 lbs  Modified plank walk up on aerobic stepper 2 x 10  Bird dog 2 x 10  Lower trap setting with shoulder ER 2 x 15, red band  Lower trap setting at wall d/c due to pain Prone single arm Y 2 x 15  Therapeutic Activity: Farmer's carry 15 lbs 3 laps in gym     [facility] Adult PT Treatment:                                                DATE: 09/05/23  Neuromuscular re-ed: Tall plank 3 x 20 sec  Prone row with ER 2 x 15 @ 3 lbs  Wall ball circles 2 x 20 CW/CCW flexion and abduction  Shoulders walks at plinth with red theraband around wrist 3 sets d/b  Side plank 2 x 20 sec each  Renegade row modified on knees 2  x 10; 5 lbs  Resisted shoulder extension blue band 2 x 15  Medball crunch 2 x 20; 6 lb med ball  Therapeutic Activity: Standing shoulder flexion AROM with physioball x 10  Farmer's carry 3 laps in gym, 10 lb kettlebell     PATIENT EDUCATION: Education details: HEP review   Person educated: Patient Education method: Programmer, multimedia,  Education comprehension: verbalized understanding,  HOME EXERCISE PROGRAM: Access Code: [facility] URL: [email or web address] Date: 09/07/2023 Prepared by: Lucie Meeter  Exercises - Prone W Scapular Retraction  - 1 x daily - 3 x weekly - 2 sets - 15 reps - Prone Shoulder Horizontal Abduction with Thumbs Up  - 1 x daily - 3 x weekly - 2 sets - 15 reps - Standing Shoulder Flexion to 90 Degrees  - 1 x daily - 3 x weekly - 1 sets - 10 reps - Standing Shoulder Scaption  - 1 x daily - 3 x weekly - 1 sets - 10 reps - Standing Shoulder Row with Anchored Resistance  - 1 x daily - 3 x weekly - 2 sets - 10 reps - Sidelying Shoulder External Rotation  - 1 x daily - 3 x weekly - 2 sets - 10 reps - Shoulder Internal Rotation with Resistance  - 1 x daily - 3 x weekly - 2 sets - 10 reps - Shoulder External Rotation with Anchored Resistance  - 1 x daily - 3 x weekly - 2 sets - 10 reps - Sidelying Shoulder Abduction  - 1 x daily - 3 x weekly - 2 sets - 10 reps - Shoulder External Rotation and Scapular Retraction with Resistance  - 1 x daily - 3 x weekly - 2 sets - 10 reps - Supine Serratus Punches Resistance  - 1 x daily - 3 x weekly - 2 sets - 10 reps - Standing Elbow Extension with Self-Anchored Resistance  - 1 x daily - 3 x weekly - 2 sets - 10 reps - Shoulder Extension Reactive Isometrics with Elbow Extended  - 1 x daily - 3 x weekly -  2 sets - 10 reps - Standing Shoulder Flexion Reactive Isometrics with Elbow Extended  - 1 x daily - 3 x weekly - 2 sets - 10 reps - Standard Plank  - 1 x daily - 3 x weekly - 3 sets - 30 sec  hold - Standing Bent Over Single  Arm Shoulder Row  - 1 x daily - 3 x weekly - 3 sets - 10 reps - Bird Dog  - 1 x daily - 3 x weekly - 3 sets - 10 reps - Prone Single Arm Shoulder Y  - 1 x daily - 3 x weekly - 2 sets - 15 reps  ASSESSMENT:  CLINICAL IMPRESSION: Continued with shoulder and periscapular strengthening without onset of pain. Introduced modified push-up today without onset of pain, but fatigues quickly with this exercise. Challenged with single UE CKC activity on the RUE.   OBJECTIVE IMPAIRMENTS: decreased activity tolerance, decreased endurance, decreased knowledge of condition, decreased mobility, decreased ROM, decreased strength, increased edema, impaired UE functional use, postural dysfunction, and pain.    GOALS: Goals reviewed with patient? Yes  SHORT TERM GOALS: Target date: 08/10/2023   Patient will be independent and compliant with initial HEP.  Baseline: initial HEP issued  Goal status: MET  2.  Patient will demonstrate full Rt elbow extension AROM to improve ability to complete reaching activity.  Baseline: see above Goal status: MET  3.  Patient will demonstrate at least 120 degrees of Rt shoulder AROM to improve ability to reach overhead.  Baseline: shoulder ROM deferred  Goal status: MET  4.  Patient will demonstrate at least 50 degrees of Rt shoulder ER/IR AROM to improve ability to complete self-care activities.  Baseline: shoulder ROM deferred  Goal status: MET    LONG TERM GOALS: Target date: 09/23/23  Patient will score </= 20 % disability on the QuickDASH (MCID is 8-15.9) to signify clinically meaningful improvement in functional abilities.  Baseline: see above Goal status: INITIAL  2.  Patient will demonstrate 5/5 Rt shoulder strength to improve ability to throw baseball.  Baseline: deferred  Goal status: INITIAL  3.  Patient will demonstrate 5/5 Rt elbow strength to improve ability to lift/carry items.  Baseline: deferred Goal status: INITIAL  4.  Patient will be able  to perform strengthening/stabilization exercises in 90/90 positioning without onset of shoulder pain in order to progress to throwing activity.  Baseline: unable  Goal status: INITIAL  5. Patient will demonstrate 5/5 bilateral middle trap strength to improve postural stability.   Baseline: deferred  Goal Status: INITIAL   PLAN: PT FREQUENCY: 2x/week  PT DURATION: 12 weeks  PLANNED INTERVENTIONS: 97164- PT Re-evaluation, 97750- Physical Performance Testing, 97110-Therapeutic exercises, 97530- Therapeutic activity, [id number]- Neuromuscular re-education, 97535- Self Care, 02859- Manual therapy, [id number]- Aquatic Therapy, H9716- Electrical stimulation (unattended), [id number]- Electrical stimulation (manual), 97016- Vasopneumatic device, Taping, Dry Needling, Joint mobilization, Cryotherapy, and Moist heat  PLAN FOR NEXT SESSION: continue per protocol;   Jadden Yim, PT, DPT, ATC 09/14/23 8:45 AM

## 2023-09-19 ENCOUNTER — Encounter

## 2023-09-21 ENCOUNTER — Encounter

## 2023-09-26 ENCOUNTER — Ambulatory Visit

## 2023-09-26 NOTE — Therapy (Incomplete)
 OUTPATIENT PHYSICAL THERAPY UPPER EXTREMITY TREATMENT   Patient Name: Brian Holmes MRN: [id number] DOB:[birth year], 23 y.o., male Today's Date: 09/26/2023  END OF SESSION:             No past medical history on file. 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  Rationale for Evaluation and Treatment: Rehabilitation  ONSET DATE: 06/20/23  SUBJECTIVE:                                                                                                                                                                                      SUBJECTIVE STATEMENT: No complaints.   EVAL: Patient underwent Rt shoulder surgery on 06/20/23. He has been in his sling for all activity except for showering since the surgery. 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 Patient wants to be able to play baseball for fun, but not competively. Pitches Rt-handed; Bats Lt-handed.  Hand dominance: Ambidextrous  PERTINENT HISTORY: Rt shoulder bicep tenodesis 06/20/23 Patient underwent Rt shoulder surgery on 06/20/23. He has been in his sling for all activity except for showering since the surgery. 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 Patient wants to be able to play baseball for fun, but not competively. Pitches Rt-handed; Bats Lt-handed.  PAIN:  Are you having pain? No  PRECAUTIONS: Other: see post-op protocol- CKC as  tolerated, weight lifting in 8 weeks 08/22/23  WEIGHT BEARING RESTRICTIONS: Yes WBAT RUE  FALLS:  Has patient fallen in last 6 months? 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   NEXT MD VISIT: 08/17/23  OBJECTIVE:  Note: Objective measures were completed at Evaluation unless otherwise noted. PATIENT SURVEYS :  Quick Dash 77.8% disability   COGNITION: Overall cognitive status: Within functional limits for tasks assessed     POSTURE: Rounder shoulders in sling   UPPER EXTREMITY ROM: shoulder PROM deferred  Passive ROM Right eval Left eval Right 5/27 PROM 07/11/23 Rt  PROM 07/14/23 Rt PROM 07/18/23 Rt AROM 07/25/23 Rt AROM 08/10/23 Rt AROM 08/17/23 Rt AROM   Shoulder flexion   At 30 degrees abd: 92  Full  169  Full  Full   Shoulder extension           Shoulder abduction   35 130    155 175  Shoulder adduction           Shoulder internal rotation       61 70 80  Shoulder external rotation   At 30 degrees abd: 20    66 91 100  Elbow flexion Full           Elbow extension Lacking 40     0     Wrist flexion           Wrist extension           Wrist ulnar deviation           Wrist radial deviation           Wrist pronation           Wrist supination           (Blank rows = not tested)  UPPER EXTREMITY MMT: MMT Right eval Left eval  Shoulder flexion    Shoulder extension    Shoulder abduction    Shoulder adduction    Shoulder internal rotation    Shoulder external rotation    Middle trapezius    Lower trapezius    Elbow flexion    Elbow extension    Wrist flexion    Wrist extension    Wrist ulnar deviation    Wrist radial deviation    Wrist pronation    Wrist supination    Grip strength (lbs)    (Blank rows = not tested) RUE MMT deferred due to post-op acuity   PALPATION:  Not assessed  [facility] Adult PT Treatment:                                                DATE:  09/14/23  Neuromuscular re-ed: Modified push-up at wall 2 x 10  Sideplank thread the needle x 10 each  Prone single arm Y 2 x 15; 1 lb Resisted PNF D2 flexion supine green band 2 x 10  Standing ER cable 2 x 5; 5 lbs  90/90 IR blue band 2 x 15 Resisted standing lateral trunk flexion with kettlebell hold 2 x 15 @ 15 lbs  Arm circles CW/CCW x 30 sec each; 1 lb  Therapeutic Activity: Standing scaption 2 x 15 @ 3 lbs  Sled push 1 lap, 50 lbs     [facility] Adult PT Treatment:                                                DATE: 09/07/23 Therapeutic Exercise: Lateral raise 2 x 10; 5 lbs  Tricep kickback 2 x 10; 10 lbs  Sidelying ER 2 x 10 @ 3 lbs   Neuromuscular re-ed: Renegade row modified on knees 2 x 10; 5 lbs  Modified plank dumbbell pass 2 x 10; 5 lbs  Wall ball circles abduction 2 x 20 CW/CCW Bent over row 2 x 15 @  10 lbs  Modified plank walk up on aerobic stepper 2 x 10  Bird dog 2 x 10  Lower trap setting with shoulder ER 2 x 15, red band  Lower trap setting at wall d/c due to pain Prone single arm Y 2 x 15  Therapeutic Activity: Farmer's carry 15 lbs 3 laps in gym     [facility] Adult PT Treatment:                                                DATE: 09/05/23  Neuromuscular re-ed: Tall plank 3 x 20 sec  Prone row with ER 2 x 15 @ 3 lbs  Wall ball circles 2 x 20 CW/CCW flexion and abduction  Shoulders walks at plinth with red theraband around wrist 3 sets d/b  Side plank 2 x 20 sec each  Renegade row modified on knees 2 x 10; 5 lbs  Resisted shoulder extension blue band 2 x 15  Medball crunch 2 x 20; 6 lb med ball  Therapeutic Activity: Standing shoulder flexion AROM with physioball x 10  Farmer's carry 3 laps in gym, 10 lb kettlebell     PATIENT EDUCATION: Education details: HEP review   Person educated: Patient Education method: Programmer, multimedia,  Education comprehension: verbalized understanding,  HOME EXERCISE PROGRAM: Access Code: [facility] URL:  [email or web address] Date: 09/07/2023 Prepared by: Lucie Meeter  Exercises - Prone W Scapular Retraction  - 1 x daily - 3 x weekly - 2 sets - 15 reps - Prone Shoulder Horizontal Abduction with Thumbs Up  - 1 x daily - 3 x weekly - 2 sets - 15 reps - Standing Shoulder Flexion to 90 Degrees  - 1 x daily - 3 x weekly - 1 sets - 10 reps - Standing Shoulder Scaption  - 1 x daily - 3 x weekly - 1 sets - 10 reps - Standing Shoulder Row with Anchored Resistance  - 1 x daily - 3 x weekly - 2 sets - 10 reps - Sidelying Shoulder External Rotation  - 1 x daily - 3 x weekly - 2 sets - 10 reps - Shoulder Internal Rotation with Resistance  - 1 x daily - 3 x weekly - 2 sets - 10 reps - Shoulder External Rotation with Anchored Resistance  - 1 x daily - 3 x weekly - 2 sets - 10 reps - Sidelying Shoulder Abduction  - 1 x daily - 3 x weekly - 2 sets - 10 reps - Shoulder External Rotation and Scapular Retraction with Resistance  - 1 x daily - 3 x weekly - 2 sets - 10 reps - Supine Serratus Punches Resistance  - 1 x daily - 3 x weekly - 2 sets - 10 reps - Standing Elbow Extension with Self-Anchored Resistance  - 1 x daily - 3 x weekly - 2 sets - 10 reps - Shoulder Extension Reactive Isometrics with Elbow Extended  - 1 x daily - 3 x weekly - 2 sets - 10 reps - Standing Shoulder Flexion Reactive Isometrics with Elbow Extended  - 1 x daily - 3 x weekly - 2 sets - 10 reps - Standard Plank  - 1 x daily - 3 x weekly - 3 sets - 30 sec  hold - Standing Bent Over Single Arm Shoulder Row  - 1 x daily - 3 x weekly - 3  sets - 10 reps - Bird Dog  - 1 x daily - 3 x weekly - 3 sets - 10 reps - Prone Single Arm Shoulder Y  - 1 x daily - 3 x weekly - 2 sets - 15 reps  ASSESSMENT:  CLINICAL IMPRESSION: Patient is progressing as expected at 14 weeks s/p Rt biceps tenodesis. Per his protocol he is just recently cleared to begin bicep isotonic strengthening. ***  OBJECTIVE IMPAIRMENTS: decreased activity  tolerance, decreased endurance, decreased knowledge of condition, decreased mobility, decreased ROM, decreased strength, increased edema, impaired UE functional use, postural dysfunction, and pain.    GOALS: Goals reviewed with patient? Yes  SHORT TERM GOALS: Target date: 08/10/2023   Patient will be independent and compliant with initial HEP.  Baseline: initial HEP issued  Goal status: MET  2.  Patient will demonstrate full Rt elbow extension AROM to improve ability to complete reaching activity.  Baseline: see above Goal status: MET  3.  Patient will demonstrate at least 120 degrees of Rt shoulder AROM to improve ability to reach overhead.  Baseline: shoulder ROM deferred  Goal status: MET  4.  Patient will demonstrate at least 50 degrees of Rt shoulder ER/IR AROM to improve ability to complete self-care activities.  Baseline: shoulder ROM deferred  Goal status: MET    LONG TERM GOALS: Target date: 09/23/23  Patient will score </= 20 % disability on the QuickDASH (MCID is 8-15.9) to signify clinically meaningful improvement in functional abilities.  Baseline: see above Goal status: INITIAL  2.  Patient will demonstrate 5/5 Rt shoulder strength to improve ability to throw baseball.  Baseline: deferred  Goal status: INITIAL  3.  Patient will demonstrate 5/5 Rt elbow strength to improve ability to lift/carry items.  Baseline: deferred Goal status: INITIAL  4.  Patient will be able to perform strengthening/stabilization exercises in 90/90 positioning without onset of shoulder pain in order to progress to throwing activity.  Baseline: unable  Goal status: INITIAL  5. Patient will demonstrate 5/5 bilateral middle trap strength to improve postural stability.   Baseline: deferred  Goal Status: INITIAL   PLAN: PT FREQUENCY: 2x/week  PT DURATION: 12 weeks  PLANNED INTERVENTIONS: 97164- PT Re-evaluation, 97750- Physical Performance Testing, 97110-Therapeutic exercises, 97530-  Therapeutic activity, [id number]- Neuromuscular re-education, 97535- Self Care, 02859- Manual therapy, [id number]- Aquatic Therapy, H9716- Electrical stimulation (unattended), [id number]- Electrical stimulation (manual), 97016- Vasopneumatic device, Taping, Dry Needling, Joint mobilization, Cryotherapy, and Moist heat  PLAN FOR NEXT SESSION: continue per protocol;   Maor Meckel, PT, DPT, ATC 09/26/23 7:50 AM

## 2023-09-28 ENCOUNTER — Ambulatory Visit

## 2023-09-28 DIAGNOSIS — M6281 Muscle weakness (generalized): Secondary | ICD-10-CM

## 2023-09-28 DIAGNOSIS — M25511 Pain in right shoulder: Secondary | ICD-10-CM

## 2023-09-28 DIAGNOSIS — M25611 Stiffness of right shoulder, not elsewhere classified: Secondary | ICD-10-CM

## 2023-09-28 DIAGNOSIS — R600 Localized edema: Secondary | ICD-10-CM

## 2023-09-28 NOTE — Therapy (Signed)
 OUTPATIENT PHYSICAL THERAPY UPPER EXTREMITY TREATMENT RE-CERTIFICATION   Patient Name: Brian Holmes MRN: [id number] DOB:2000-04-24, 23 y.o., male Today's Date: 09/28/2023  END OF SESSION:  PT End of Session - 09/28/23 0802     Visit Number 19    Number of Visits 31    Date for PT Re-Evaluation 11/11/23    Authorization Type BCBS    PT Start Time 0802    PT Stop Time 0842    PT Time Calculation (min) 40 min    Activity Tolerance Patient tolerated treatment well    Behavior During Therapy [facility] for tasks assessed/performed                    History reviewed. No pertinent past medical history. 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    PCP: Geofm Waldo HERO, MD  REFERRING PROVIDER: Prentiss Lesches, MD  REFERRING DIAG: strain of muscles and tendons of the rotator cuff of right shoulder, subsequent encounter   THERAPY DIAG:  Acute pain of right shoulder  Stiffness of right shoulder, not elsewhere classified  Muscle weakness (generalized)  Localized edema  Rationale for Evaluation and Treatment: Rehabilitation  ONSET DATE: 06/20/23  SUBJECTIVE:                                                                                                                                                                                      SUBJECTIVE STATEMENT: Patient reports the shoulder feels great. Wants to be able to golf and play baseball.   EVAL: Patient underwent Rt shoulder surgery on 06/20/23. He has been in his sling for all activity except for showering since the surgery. 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 Patient wants to be able to play baseball for fun, but not  competively. Pitches Rt-handed; Bats Lt-handed.  Hand dominance: Ambidextrous  PERTINENT HISTORY: Rt shoulder bicep tenodesis 06/20/23 Patient underwent Rt shoulder surgery on 06/20/23. He has been in his sling for all activity except for showering since the surgery. 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 Patient wants to be able to play baseball for fun, but not competively. Pitches Rt-handed; Bats Lt-handed.  PAIN:  Are you having pain? No  PRECAUTIONS: Other: see post-op protocol- CKC  as tolerated, weight lifting in 8 weeks 08/22/23  WEIGHT BEARING RESTRICTIONS: Yes WBAT RUE  FALLS:  Has patient fallen in last 6 months? 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   NEXT MD VISIT: 08/17/23  OBJECTIVE:  Note: Objective measures were completed at Evaluation unless otherwise noted. PATIENT SURVEYS :  Quick Dash 77.8% disability  QuickDASH 11.4% disability   COGNITION: Overall cognitive status: Within functional limits for tasks assessed     POSTURE: Rounder shoulders in sling   UPPER EXTREMITY ROM: shoulder PROM deferred  Passive ROM Right eval Left eval Right 5/27 PROM 07/11/23 Rt PROM 07/14/23 Rt PROM 07/18/23 Rt AROM 07/25/23 Rt AROM 08/10/23 Rt AROM 08/17/23 Rt AROM   Shoulder flexion   At 30 degrees abd: 92  Full  169  Full  Full   Shoulder extension           Shoulder abduction   35 130    155 175  Shoulder adduction           Shoulder internal rotation       61 70 80  Shoulder external rotation   At 30 degrees abd: 20    66 91 100  Elbow flexion Full           Elbow extension Lacking 40     0     Wrist flexion           Wrist extension           Wrist ulnar deviation           Wrist radial deviation           Wrist pronation           Wrist supination           (Blank rows = not tested)  UPPER EXTREMITY  MMT: MMT Right 09/28/23 Left 09/28/23   Shoulder flexion 4   Shoulder extension    Shoulder abduction 5   Shoulder adduction    Shoulder internal rotation 5   Shoulder external rotation 4+   Middle trapezius 4 4  Lower trapezius    Elbow flexion 4   Elbow extension 5   Wrist flexion    Wrist extension    Wrist ulnar deviation    Wrist radial deviation    Wrist pronation    Wrist supination    Grip strength (lbs)    (Blank rows = not tested) RUE MMT deferred due to post-op acuity   PALPATION:  Not assessed  [facility] Adult PT Treatment:                                                DATE: 09/28/23 Therapeutic Exercise: Bicep curl 10 lbs 2 x 15 Reviewed and updated HEP   Neuromuscular re-ed: 90/90 ball toss at wall green med ball 2 x 20  Body blade elbow at 90 degrees neutral and pronated 2 x 20 sec each Sit up med ball toss 2 x 10 @ 6 lbs  Therapeutic Activity: Re-assessment to determine overall progress, educating patient on progress towards goals  Standing shoulder flexion 2 x 15 @ 3 lbs  Overhead press 2 x 15 @ 4 lbs  Farmer's carry 2 laps 20 lbs  [facility] Adult PT Treatment:                                                DATE: 09/14/23  Neuromuscular re-ed: Modified push-up at wall 2 x 10  Sideplank thread the needle x 10 each  Prone single arm Y 2 x 15; 1 lb Resisted PNF D2 flexion supine green band 2 x 10  Standing ER cable 2 x 5; 5 lbs  90/90 IR blue band 2 x 15 Resisted standing lateral trunk flexion with kettlebell hold 2 x 15 @ 15 lbs  Arm circles CW/CCW x 30 sec each; 1 lb  Therapeutic Activity: Standing scaption 2 x 15 @ 3 lbs  Sled push 1 lap, 50 lbs     [facility] Adult PT Treatment:                                                DATE: 09/07/23 Therapeutic Exercise: Lateral raise 2 x 10; 5 lbs  Tricep kickback 2 x 10; 10 lbs  Sidelying ER 2 x 10 @ 3 lbs   Neuromuscular re-ed: Renegade row modified on knees 2 x 10; 5 lbs  Modified plank dumbbell pass 2 x  10; 5 lbs  Wall ball circles abduction 2 x 20 CW/CCW Bent over row 2 x 15 @ 10 lbs  Modified plank walk up on aerobic stepper 2 x 10  Bird dog 2 x 10  Lower trap setting with shoulder ER 2 x 15, red band  Lower trap setting at wall d/c due to pain Prone single arm Y 2 x 15  Therapeutic Activity: Farmer's carry 15 lbs 3 laps in gym    PATIENT EDUCATION: Education details: HEP update   Person educated: Patient Education method: Explanation, demo, cues, handout Education comprehension: verbalized understanding,returned demo, cues   HOME EXERCISE PROGRAM: Access Code: [facility] URL: [email or web address] Date: 09/28/2023 Prepared by: Lucie Meeter  Exercises - Prone W Scapular Retraction  - 1 x daily - 3 x weekly - 2 sets - 15 reps - Prone Shoulder Horizontal Abduction with Thumbs Up  - 1 x daily - 3 x weekly - 2 sets - 15 reps - Prone Single Arm Shoulder Y  - 1 x daily - 3 x weekly - 2 sets - 15 reps - Standing Shoulder Row with Anchored Resistance  - 1 x daily - 3 x weekly - 2 sets - 10 reps - Sidelying Shoulder External Rotation  - 1 x daily - 3 x weekly - 2 sets - 10 reps - Shoulder External Rotation with Anchored Resistance  - 1 x daily - 3 x weekly - 2 sets - 10 reps - Shoulder External Rotation and Scapular Retraction with Resistance  - 1 x daily - 3 x weekly - 2 sets - 10 reps - Supine Serratus Punches Resistance  - 1 x daily - 3 x weekly - 2 sets - 10 reps - Standing Elbow Extension with Self-Anchored Resistance  - 1 x daily - 3 x weekly - 2 sets - 10 reps - Standard Plank  - 1 x daily - 3 x weekly - 3 sets - 30 sec  hold - Standing Bent Over Single Arm Shoulder Row  -  1 x daily - 3 x weekly - 3 sets - 10 reps - Bird Dog  - 1 x daily - 3 x weekly - 3 sets - 10 reps - Standing Bicep Curls Supinated with Dumbbells  - 1 x daily - 3 x weekly - 2 sets - 15 reps - Standing Shoulder Flexion to 90 Degrees with Dumbbells  - 1 x daily - 3 x weekly - 2 sets - 15  reps  ASSESSMENT:  CLINICAL IMPRESSION: Patient is progressing as expected at 14 weeks s/p Rt biceps tenodesis. He has full and pain free Rt shoulder AROM. Does have ongoing weakness with shoulder flexor,ER, elbow flexor, and middle trap MMT.  Per his protocol he is just recently cleared to begin bicep isotonic strengthening, so we initiated this at today's visit with good tolerance, though quickly fatigues in the RUE. He will benefit from continued skilled PT 1-2 x week for 6 additional weeks to address lingering strength deficits and safely progress back into sport specific activity in order to optimize his function.   OBJECTIVE IMPAIRMENTS: decreased activity tolerance, decreased endurance, decreased knowledge of condition, decreased mobility, decreased ROM, decreased strength, increased edema, impaired UE functional use, postural dysfunction, and pain.    GOALS: Goals reviewed with patient? Yes  SHORT TERM GOALS: Target date: 08/10/2023   Patient will be independent and compliant with initial HEP.  Baseline: initial HEP issued  Goal status: MET  2.  Patient will demonstrate full Rt elbow extension AROM to improve ability to complete reaching activity.  Baseline: see above Goal status: MET  3.  Patient will demonstrate at least 120 degrees of Rt shoulder AROM to improve ability to reach overhead.  Baseline: shoulder ROM deferred  Goal status: MET  4.  Patient will demonstrate at least 50 degrees of Rt shoulder ER/IR AROM to improve ability to complete self-care activities.  Baseline: shoulder ROM deferred  Goal status: MET    LONG TERM GOALS: Target date: 09/23/23  Patient will score </= 20 % disability on the QuickDASH (MCID is 8-15.9) to signify clinically meaningful improvement in functional abilities.  Baseline: see above Goal status: MET  2.  Patient will demonstrate 5/5 Rt shoulder strength to improve ability to throw baseball.  Baseline: deferred  Goal status:  partially met   3.  Patient will demonstrate 5/5 Rt elbow strength to improve ability to lift/carry items.  Baseline: deferred Goal status: partially met   4.  Patient will be able to perform strengthening/stabilization exercises in 90/90 positioning without onset of shoulder pain in order to progress to throwing activity.  Baseline: unable  09/28/23: has experienced mild pain with 90/90 ER strengthening  Goal status: progressing   5. Patient will demonstrate 5/5 bilateral middle trap strength to improve postural stability.   Baseline: deferred  Goal Status: progressing    PLAN: PT FREQUENCY: 1-2x/week  PT DURATION: 6 weeks  PLANNED INTERVENTIONS: 97164- PT Re-evaluation, 97750- Physical Performance Testing, 97110-Therapeutic exercises, 97530- Therapeutic activity, [id number]- Neuromuscular re-education, 97535- Self Care, 02859- Manual therapy, [id number]- Aquatic Therapy, H9716- Electrical stimulation (unattended), [id number]- Electrical stimulation (manual), 97016- Vasopneumatic device, Taping, Dry Needling, Joint mobilization, Cryotherapy, and Moist heat  PLAN FOR NEXT SESSION: continue per protocol;   Samy Ryner, PT, DPT, ATC 09/28/23 8:42 AM

## 2023-10-03 ENCOUNTER — Ambulatory Visit

## 2023-10-03 DIAGNOSIS — R600 Localized edema: Secondary | ICD-10-CM

## 2023-10-03 DIAGNOSIS — M6281 Muscle weakness (generalized): Secondary | ICD-10-CM

## 2023-10-03 DIAGNOSIS — M25611 Stiffness of right shoulder, not elsewhere classified: Secondary | ICD-10-CM

## 2023-10-03 DIAGNOSIS — M25511 Pain in right shoulder: Secondary | ICD-10-CM | POA: Diagnosis not present

## 2023-10-03 NOTE — Therapy (Signed)
 OUTPATIENT PHYSICAL THERAPY UPPER EXTREMITY TREATMENT    Patient Name: Brian Holmes MRN: [id number] DOB:25-Dec-2000, 23 y.o., male Today's Date: 10/03/2023  END OF SESSION:  PT End of Session - 10/03/23 0801     Visit Number 20    Number of Visits 31    Date for PT Re-Evaluation 11/11/23    Authorization Type BCBS    PT Start Time 0801    PT Stop Time 0841    PT Time Calculation (min) 40 min    Activity Tolerance Patient tolerated treatment well    Behavior During Therapy [facility] for tasks assessed/performed                     History reviewed. No pertinent past medical history. 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    PCP: Geofm Waldo HERO, MD  REFERRING PROVIDER: Prentiss Lesches, MD  REFERRING DIAG: strain of muscles and tendons of the rotator cuff of right shoulder, subsequent encounter   THERAPY DIAG:  Acute pain of right shoulder  Stiffness of right shoulder, not elsewhere classified  Muscle weakness (generalized)  Localized edema  Rationale for Evaluation and Treatment: Rehabilitation  ONSET DATE: 06/20/23  SUBJECTIVE:                                                                                                                                                                                      SUBJECTIVE STATEMENT: Feels great. Patient reported the shoulder was sore about a day after last session. Did have some discomfort with overhead strengthening at the gym, so stopped this.   EVAL: Patient underwent Rt shoulder surgery on 06/20/23. He has been in his sling for all activity except for showering since the surgery. 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 Patient wants to be able to play baseball for fun, but not competively. Pitches Rt-handed; Bats Lt-handed.  Hand dominance: Ambidextrous  PERTINENT HISTORY: Rt shoulder bicep tenodesis 06/20/23 Patient underwent Rt shoulder surgery on 06/20/23. He has been in his sling for all activity except for showering since the surgery. 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 Patient wants to be able to play baseball for fun, but not competively. Pitches Rt-handed; Bats Lt-handed.  PAIN:  Are you having pain? No  PRECAUTIONS: Other: see post-op protocol- CKC as tolerated, weight lifting in 8 weeks 08/22/23  WEIGHT BEARING RESTRICTIONS: Yes WBAT RUE  FALLS:  Has patient fallen in last 6 months? 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   NEXT MD VISIT: 08/17/23  OBJECTIVE:  Note: Objective measures were completed at Evaluation unless otherwise noted. PATIENT SURVEYS :  Quick Dash 77.8% disability  QuickDASH 11.4% disability   COGNITION: Overall cognitive status: Within functional limits for tasks assessed     POSTURE: Rounder shoulders in sling   UPPER EXTREMITY ROM: shoulder PROM deferred  Passive ROM Right eval Left eval Right 5/27 PROM 07/11/23 Rt PROM 07/14/23 Rt PROM 07/18/23 Rt AROM 07/25/23 Rt AROM 08/10/23 Rt AROM 08/17/23 Rt AROM   Shoulder flexion   At 30 degrees abd: 92  Full  169  Full  Full   Shoulder extension           Shoulder abduction   35 130    155 175  Shoulder adduction           Shoulder internal rotation       61 70 80  Shoulder external rotation   At 30 degrees abd: 20    66 91 100  Elbow flexion Full           Elbow extension Lacking 40     0     Wrist flexion           Wrist extension           Wrist ulnar deviation           Wrist radial deviation           Wrist pronation           Wrist  supination           (Blank rows = not tested)  UPPER EXTREMITY MMT: MMT Right 09/28/23 Left 09/28/23   Shoulder flexion 4   Shoulder extension    Shoulder abduction 5   Shoulder adduction    Shoulder internal rotation 5   Shoulder external rotation 4+   Middle trapezius 4 4  Lower trapezius    Elbow flexion 4   Elbow extension 5   Wrist flexion    Wrist extension    Wrist ulnar deviation    Wrist radial deviation    Wrist pronation    Wrist supination    Grip strength (lbs)    (Blank rows = not tested) RUE MMT deferred due to post-op acuity   PALPATION:  Not assessed  [facility] Adult PT Treatment:                                                DATE: 10/03/23 Therapeutic Exercise: UBE level 3 x 2 min each fwd/bwd  Hammer curl 2 x 10 @ 10 lbs  HEP review   Neuromuscular re-ed: Wall clock taps with red med ball 2 x 5  Prone row to ER 3 lbs, 2 x 15  Standing shoulder flexion with red band pulses 2 x 5 Standing resisted rows 2 x 15 @ 35 lbs  Body blade 2 x 20 sec each neutral and pronated shoulder flexed at 90 degrees Serratus wall slides  with red band 2 x 15     [facility] Adult PT Treatment:                                                DATE: 09/28/23 Therapeutic Exercise: Bicep curl 10 lbs 2 x 15 Reviewed and updated HEP   Neuromuscular re-ed: 90/90 ball toss at wall green med ball 2 x 20  Body blade elbow at 90 degrees neutral and pronated 2 x 20 sec each Sit up med ball toss 2 x 10 @ 6 lbs  Therapeutic Activity: Re-assessment to determine overall progress, educating patient on progress towards goals  Standing shoulder flexion 2 x 15 @ 3 lbs  Overhead press 2 x 15 @ 4 lbs  Farmer's carry 2 laps 20 lbs     [facility] Adult PT Treatment:                                                DATE: 09/14/23  Neuromuscular re-ed: Modified push-up at wall 2 x 10  Sideplank thread the needle x 10 each  Prone single arm Y 2 x 15; 1 lb Resisted PNF D2 flexion supine green band 2 x 10   Standing ER cable 2 x 5; 5 lbs  90/90 IR blue band 2 x 15 Resisted standing lateral trunk flexion with kettlebell hold 2 x 15 @ 15 lbs  Arm circles CW/CCW x 30 sec each; 1 lb  Therapeutic Activity: Standing scaption 2 x 15 @ 3 lbs  Sled push 1 lap, 50 lbs     PATIENT EDUCATION: Education details: HEP review   Person educated: Patient Education method: Programmer, multimedia,  Education comprehension: verbalized understanding,  HOME EXERCISE PROGRAM: Access Code: [facility] URL: [email or web address] Date: 09/28/2023 Prepared by: Lucie Meeter  Exercises - Prone W Scapular Retraction  - 1 x daily - 3 x weekly - 2 sets - 15 reps - Prone Shoulder Horizontal Abduction with Thumbs Up  - 1 x daily - 3 x weekly - 2 sets - 15 reps - Prone Single Arm Shoulder Y  - 1 x daily - 3 x weekly - 2 sets - 15 reps - Standing Shoulder Row with Anchored Resistance  - 1 x daily - 3 x weekly - 2 sets - 10 reps - Sidelying Shoulder External Rotation  - 1 x daily - 3 x weekly - 2 sets - 10 reps - Shoulder External Rotation with Anchored Resistance  - 1 x daily - 3 x weekly - 2 sets - 10 reps - Shoulder External Rotation and Scapular Retraction with Resistance  - 1 x daily - 3 x weekly - 2 sets - 10 reps - Supine Serratus Punches Resistance  - 1 x daily - 3 x weekly - 2 sets - 10 reps - Standing Elbow Extension with Self-Anchored Resistance  - 1 x daily - 3 x weekly - 2 sets - 10 reps - Standard Plank  - 1 x daily - 3 x weekly - 3 sets - 30 sec  hold - Standing Bent Over Single Arm Shoulder Row  - 1 x daily - 3 x weekly - 3 sets - 10 reps - Bird Dog  - 1 x daily - 3 x  weekly - 3 sets - 10 reps - Standing Bicep Curls Supinated with Dumbbells  - 1 x daily - 3 x weekly - 2 sets - 15 reps - Standing Shoulder Flexion to 90 Degrees with Dumbbells  - 1 x daily - 3 x weekly - 2 sets - 15 reps  ASSESSMENT:  CLINICAL IMPRESSION: Continued with shoulder strength progression with emphasis on overhead  strengthening and bicep work with good tolerance. No pain reported with strength progression, though does report muscle fatigue with overhead activity.   OBJECTIVE IMPAIRMENTS: decreased activity tolerance, decreased endurance, decreased knowledge of condition, decreased mobility, decreased ROM, decreased strength, increased edema, impaired UE functional use, postural dysfunction, and pain.    GOALS: Goals reviewed with patient? Yes  SHORT TERM GOALS: Target date: 08/10/2023   Patient will be independent and compliant with initial HEP.  Baseline: initial HEP issued  Goal status: MET  2.  Patient will demonstrate full Rt elbow extension AROM to improve ability to complete reaching activity.  Baseline: see above Goal status: MET  3.  Patient will demonstrate at least 120 degrees of Rt shoulder AROM to improve ability to reach overhead.  Baseline: shoulder ROM deferred  Goal status: MET  4.  Patient will demonstrate at least 50 degrees of Rt shoulder ER/IR AROM to improve ability to complete self-care activities.  Baseline: shoulder ROM deferred  Goal status: MET    LONG TERM GOALS: Target date: 09/23/23  Patient will score </= 20 % disability on the QuickDASH (MCID is 8-15.9) to signify clinically meaningful improvement in functional abilities.  Baseline: see above Goal status: MET  2.  Patient will demonstrate 5/5 Rt shoulder strength to improve ability to throw baseball.  Baseline: deferred  Goal status: partially met   3.  Patient will demonstrate 5/5 Rt elbow strength to improve ability to lift/carry items.  Baseline: deferred Goal status: partially met   4.  Patient will be able to perform strengthening/stabilization exercises in 90/90 positioning without onset of shoulder pain in order to progress to throwing activity.  Baseline: unable  09/28/23: has experienced mild pain with 90/90 ER strengthening  Goal status: progressing   5. Patient will demonstrate 5/5 bilateral  middle trap strength to improve postural stability.   Baseline: deferred  Goal Status: progressing    PLAN: PT FREQUENCY: 1-2x/week  PT DURATION: 6 weeks  PLANNED INTERVENTIONS: 97164- PT Re-evaluation, 97750- Physical Performance Testing, 97110-Therapeutic exercises, 97530- Therapeutic activity, [id number]- Neuromuscular re-education, 97535- Self Care, [id number]- Manual therapy, [id number]- Aquatic Therapy, H9716- Electrical stimulation (unattended), [id number]- Electrical stimulation (manual), 97016- Vasopneumatic device, Taping, Dry Needling, Joint mobilization, Cryotherapy, and Moist heat  PLAN FOR NEXT SESSION: continue per protocol; try chest press, fly?  Richetta Cubillos, PT, DPT, ATC 10/03/23 8:42 AM

## 2023-10-05 ENCOUNTER — Ambulatory Visit

## 2023-10-05 DIAGNOSIS — R600 Localized edema: Secondary | ICD-10-CM

## 2023-10-05 DIAGNOSIS — M25611 Stiffness of right shoulder, not elsewhere classified: Secondary | ICD-10-CM

## 2023-10-05 DIAGNOSIS — M25511 Pain in right shoulder: Secondary | ICD-10-CM

## 2023-10-05 DIAGNOSIS — M6281 Muscle weakness (generalized): Secondary | ICD-10-CM

## 2023-10-05 NOTE — Therapy (Signed)
 OUTPATIENT PHYSICAL THERAPY UPPER EXTREMITY TREATMENT    Patient Name: Brian Holmes MRN: [id number] DOB:[birth year], 23 y.o., male Today's Date: 10/05/2023  END OF SESSION:  PT End of Session - 10/05/23 0800     Visit Number 21    Number of Visits 31    Date for PT Re-Evaluation 11/11/23    Authorization Type BCBS    PT Start Time 0800    PT Stop Time 0841    PT Time Calculation (min) 41 min    Activity Tolerance Patient tolerated treatment well    Behavior During Therapy [facility] for tasks assessed/performed                      History reviewed. No pertinent past medical history. 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    PCP: Geofm Waldo HERO, MD  REFERRING PROVIDER: Prentiss Lesches, MD  REFERRING DIAG: strain of muscles and tendons of the rotator cuff of right shoulder, subsequent encounter   THERAPY DIAG:  Acute pain of right shoulder  Stiffness of right shoulder, not elsewhere classified  Muscle weakness (generalized)  Localized edema  Rationale for Evaluation and Treatment: Rehabilitation  ONSET DATE: 06/20/23  SUBJECTIVE:                                                                                                                                                                                      SUBJECTIVE STATEMENT: Patient reports the shoulder is feeling good. He went to the gym this morning and did curls, rows, and tricep extension without issue.   EVAL: Patient underwent Rt shoulder surgery on 06/20/23. He has been in his sling for all activity except for showering since the surgery. 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 Patient wants to be able to  play baseball for fun, but not competively. Pitches Rt-handed; Bats Lt-handed.  Hand dominance: Ambidextrous  PERTINENT HISTORY: Rt shoulder bicep tenodesis 06/20/23 Patient underwent Rt shoulder surgery on 06/20/23. He has been in his sling for all activity except for showering since the surgery. 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 Patient wants to be able to play baseball for fun, but not competively. Pitches Rt-handed; Bats Lt-handed.  PAIN:  Are you  having pain? No  PRECAUTIONS: Other: see post-op protocol- CKC as tolerated, weight lifting in 8 weeks 08/22/23  WEIGHT BEARING RESTRICTIONS: Yes WBAT RUE  FALLS:  Has patient fallen in last 6 months? 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   NEXT MD VISIT: 08/17/23  OBJECTIVE:  Note: Objective measures were completed at Evaluation unless otherwise noted. PATIENT SURVEYS :  Quick Dash 77.8% disability  QuickDASH 11.4% disability   COGNITION: Overall cognitive status: Within functional limits for tasks assessed     POSTURE: Rounder shoulders in sling   UPPER EXTREMITY ROM: shoulder PROM deferred  Passive ROM Right eval Left eval Right 5/27 PROM 07/11/23 Rt PROM 07/14/23 Rt PROM 07/18/23 Rt AROM 07/25/23 Rt AROM 08/10/23 Rt AROM 08/17/23 Rt AROM   Shoulder flexion   At 30 degrees abd: 92  Full  169  Full  Full   Shoulder extension           Shoulder abduction   35 130    155 175  Shoulder adduction           Shoulder internal rotation       61 70 80  Shoulder external rotation   At 30 degrees abd: 20    66 91 100  Elbow flexion Full           Elbow extension Lacking 40     0     Wrist flexion           Wrist extension           Wrist ulnar deviation           Wrist radial deviation           Wrist pronation           Wrist supination           (Blank rows = not  tested)  UPPER EXTREMITY MMT: MMT Right 09/28/23 Left 09/28/23   Shoulder flexion 4   Shoulder extension    Shoulder abduction 5   Shoulder adduction    Shoulder internal rotation 5   Shoulder external rotation 4+   Middle trapezius 4 4  Lower trapezius    Elbow flexion 4   Elbow extension 5   Wrist flexion    Wrist extension    Wrist ulnar deviation    Wrist radial deviation    Wrist pronation    Wrist supination    Grip strength (lbs)    (Blank rows = not tested) RUE MMT deferred due to post-op acuity   PALPATION:  Not assessed  [facility] Adult PT Treatment:                                                DATE: 10/05/23 Therapeutic Exercise: UBE level 3 x 2 min each fwd/bwd  Chest press 3 x 10 ( 5 lb dumbbells first set, 10 lb bar 2-3 set)  Chest fly 2 x 10 5 lb dumbbell  Lat pull down 2 x 10 @ 20 lbs  Neuromuscular re-ed: Shoulder diagonals and horizontal abduction yellow band 2 x 10  PNF D1/D2 resistance x 5 each  High plank on bosu with tennis ball target x 5  Physioball walk out 2 x 5  Wall ball  circles 2 x 20 each cw/ccw; flexion and abduction  Bentover row with weighted bar 2 x 15 @ 10 lbs     [facility] Adult PT Treatment:                                                DATE: 10/03/23 Therapeutic Exercise: UBE level 3 x 2 min each fwd/bwd  Hammer curl 2 x 10 @ 10 lbs  HEP review   Neuromuscular re-ed: Wall clock taps with red med ball 2 x 5  Prone row to ER 3 lbs, 2 x 15  Standing shoulder flexion with red band pulses 2 x 5 Standing resisted rows 2 x 15 @ 35 lbs  Body blade 2 x 20 sec each neutral and pronated shoulder flexed at 90 degrees Serratus wall slides with red band 2 x 15     [facility] Adult PT Treatment:                                                DATE: 09/28/23 Therapeutic Exercise: Bicep curl 10 lbs 2 x 15 Reviewed and updated HEP   Neuromuscular re-ed: 90/90 ball toss at wall green med ball 2 x 20  Body blade elbow at 90 degrees neutral and  pronated 2 x 20 sec each Sit up med ball toss 2 x 10 @ 6 lbs  Therapeutic Activity: Re-assessment to determine overall progress, educating patient on progress towards goals  Standing shoulder flexion 2 x 15 @ 3 lbs  Overhead press 2 x 15 @ 4 lbs  Farmer's carry 2 laps 20 lbs      PATIENT EDUCATION: Education details: HEP update   Person educated: Patient Education method: Programmer, multimedia, demo, cues, handout Education comprehension: verbalized understanding, returned demo, cues   HOME EXERCISE PROGRAM: Access Code: [facility] URL: [email or web address] Date: 10/05/2023 Prepared by: Lucie Meeter  Exercises - Prone W Scapular Retraction  - 1 x daily - 3 x weekly - 2 sets - 15 reps - Prone Shoulder Horizontal Abduction with Thumbs Up  - 1 x daily - 3 x weekly - 2 sets - 15 reps - Prone Single Arm Shoulder Y  - 1 x daily - 3 x weekly - 2 sets - 15 reps - Standing Shoulder Row with Anchored Resistance  - 1 x daily - 3 x weekly - 2 sets - 10 reps - Sidelying Shoulder External Rotation  - 1 x daily - 3 x weekly - 2 sets - 10 reps - Shoulder External Rotation with Anchored Resistance  - 1 x daily - 3 x weekly - 2 sets - 10 reps - Shoulder External Rotation and Scapular Retraction with Resistance  - 1 x daily - 3 x weekly - 2 sets - 10 reps - Supine Serratus Punches Resistance  - 1 x daily - 3 x weekly - 2 sets - 10 reps - Standing Elbow Extension with Self-Anchored Resistance  - 1 x daily - 3 x weekly - 2 sets - 10 reps - Standard Plank  - 1 x daily - 3 x weekly - 3 sets - 30 sec  hold - Standing Bent Over Single Arm Shoulder Row  - 1 x daily - 3 x weekly -  3 sets - 10 reps - Bird Dog  - 1 x daily - 3 x weekly - 3 sets - 10 reps - Standing Bicep Curls Supinated with Dumbbells  - 1 x daily - 3 x weekly - 2 sets - 15 reps - Standing Shoulder Flexion to 90 Degrees with Dumbbells  - 1 x daily - 3 x weekly - 2 sets - 15 reps - Standing Shoulder Diagonal Horizontal Abduction 60/120  Degrees with Resistance  - 1 x daily - 3 x weekly - 2 sets - 10 reps  ASSESSMENT:  CLINICAL IMPRESSION: Introduced chest press and fly today with light weight without onset of pain. Does note pulling in the shoulder with chest fly. Progressed UE CKC strengthening with patient challenged with dynamic single UE CKC activity. Fatigue reported at conclusion, but overall good tolerance.   OBJECTIVE IMPAIRMENTS: decreased activity tolerance, decreased endurance, decreased knowledge of condition, decreased mobility, decreased ROM, decreased strength, increased edema, impaired UE functional use, postural dysfunction, and pain.    GOALS: Goals reviewed with patient? Yes  SHORT TERM GOALS: Target date: 08/10/2023   Patient will be independent and compliant with initial HEP.  Baseline: initial HEP issued  Goal status: MET  2.  Patient will demonstrate full Rt elbow extension AROM to improve ability to complete reaching activity.  Baseline: see above Goal status: MET  3.  Patient will demonstrate at least 120 degrees of Rt shoulder AROM to improve ability to reach overhead.  Baseline: shoulder ROM deferred  Goal status: MET  4.  Patient will demonstrate at least 50 degrees of Rt shoulder ER/IR AROM to improve ability to complete self-care activities.  Baseline: shoulder ROM deferred  Goal status: MET    LONG TERM GOALS: Target date: 09/23/23  Patient will score </= 20 % disability on the QuickDASH (MCID is 8-15.9) to signify clinically meaningful improvement in functional abilities.  Baseline: see above Goal status: MET  2.  Patient will demonstrate 5/5 Rt shoulder strength to improve ability to throw baseball.  Baseline: deferred  Goal status: partially met   3.  Patient will demonstrate 5/5 Rt elbow strength to improve ability to lift/carry items.  Baseline: deferred Goal status: partially met   4.  Patient will be able to perform strengthening/stabilization exercises in 90/90  positioning without onset of shoulder pain in order to progress to throwing activity.  Baseline: unable  09/28/23: has experienced mild pain with 90/90 ER strengthening  Goal status: progressing   5. Patient will demonstrate 5/5 bilateral middle trap strength to improve postural stability.   Baseline: deferred  Goal Status: progressing    PLAN: PT FREQUENCY: 1-2x/week  PT DURATION: 6 weeks  PLANNED INTERVENTIONS: 97164- PT Re-evaluation, 97750- Physical Performance Testing, 97110-Therapeutic exercises, 97530- Therapeutic activity, [id number]- Neuromuscular re-education, 97535- Self Care, 02859- Manual therapy, [id number]- Aquatic Therapy, H9716- Electrical stimulation (unattended), [id number]- Electrical stimulation (manual), 97016- Vasopneumatic device, Taping, Dry Needling, Joint mobilization, Cryotherapy, and Moist heat  PLAN FOR NEXT SESSION: continue per protocol; sport specifics   Lucie Meeter, PT, DPT, ATC 10/05/23 8:41 AM

## 2023-10-10 ENCOUNTER — Encounter: Payer: Self-pay | Admitting: Sports Medicine

## 2023-10-10 ENCOUNTER — Ambulatory Visit

## 2023-10-10 DIAGNOSIS — M25511 Pain in right shoulder: Secondary | ICD-10-CM | POA: Insufficient documentation

## 2023-10-10 DIAGNOSIS — M25611 Stiffness of right shoulder, not elsewhere classified: Secondary | ICD-10-CM | POA: Insufficient documentation

## 2023-10-10 DIAGNOSIS — M6281 Muscle weakness (generalized): Secondary | ICD-10-CM | POA: Insufficient documentation

## 2023-10-10 DIAGNOSIS — R600 Localized edema: Secondary | ICD-10-CM | POA: Diagnosis present

## 2023-10-10 NOTE — Therapy (Signed)
 OUTPATIENT PHYSICAL THERAPY UPPER EXTREMITY TREATMENT    Patient Name: Brian Holmes MRN: [id number] DOB:Dec 15, 2000, 23 y.o., male Today's Date: 10/10/2023  END OF SESSION:  PT End of Session - 10/10/23 0804     Visit Number 22    Number of Visits 31    Date for PT Re-Evaluation 11/11/23    Authorization Type BCBS    PT Start Time 0804    PT Stop Time 0844    PT Time Calculation (min) 40 min    Activity Tolerance Patient tolerated treatment well    Behavior During Therapy [facility] for tasks assessed/performed                       History reviewed. No pertinent past medical history. 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    PCP: Geofm Waldo HERO, MD  REFERRING PROVIDER: Prentiss Lesches, MD  REFERRING DIAG: strain of muscles and tendons of the rotator cuff of right shoulder, subsequent encounter   THERAPY DIAG:  Acute pain of right shoulder  Stiffness of right shoulder, not elsewhere classified  Muscle weakness (generalized)  Localized edema  Rationale for Evaluation and Treatment: Rehabilitation  ONSET DATE: 06/20/23  SUBJECTIVE:                                                                                                                                                                                      SUBJECTIVE STATEMENT: Feels great.   EVAL: Patient underwent Rt shoulder surgery on 06/20/23. He has been in his sling for all activity except for showering since the surgery. 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 Patient wants to be able to play baseball for fun, but not competively. Pitches Rt-handed; Bats Lt-handed.  Hand dominance:  Ambidextrous  PERTINENT HISTORY: Rt shoulder bicep tenodesis 06/20/23 Patient underwent Rt shoulder surgery on 06/20/23. He has been in his sling for all activity except for showering since the surgery. 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 Patient wants to be able to play baseball for fun, but not competively. Pitches Rt-handed; Bats Lt-handed.  PAIN:  Are you having pain? No  PRECAUTIONS: Other: see post-op protocol- CKC as tolerated, weight lifting in 8 weeks 08/22/23  WEIGHT  BEARING RESTRICTIONS: Yes WBAT RUE  FALLS:  Has patient fallen in last 6 months? 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   NEXT MD VISIT: 08/17/23  OBJECTIVE:  Note: Objective measures were completed at Evaluation unless otherwise noted. PATIENT SURVEYS :  Quick Dash 77.8% disability  QuickDASH 11.4% disability   COGNITION: Overall cognitive status: Within functional limits for tasks assessed     POSTURE: Rounder shoulders in sling   UPPER EXTREMITY ROM: shoulder PROM deferred  Passive ROM Right eval Left eval Right 5/27 PROM 07/11/23 Rt PROM 07/14/23 Rt PROM 07/18/23 Rt AROM 07/25/23 Rt AROM 08/10/23 Rt AROM 08/17/23 Rt AROM   Shoulder flexion   At 30 degrees abd: 92  Full  169  Full  Full   Shoulder extension           Shoulder abduction   35 130    155 175  Shoulder adduction           Shoulder internal rotation       61 70 80  Shoulder external rotation   At 30 degrees abd: 20    66 91 100  Elbow flexion Full           Elbow extension Lacking 40     0     Wrist flexion           Wrist extension           Wrist ulnar deviation           Wrist radial deviation           Wrist pronation           Wrist supination           (Blank rows = not tested)  UPPER EXTREMITY MMT: MMT Right 09/28/23 Left 09/28/23   Shoulder flexion 4   Shoulder  extension    Shoulder abduction 5   Shoulder adduction    Shoulder internal rotation 5   Shoulder external rotation 4+   Middle trapezius 4 4  Lower trapezius    Elbow flexion 4   Elbow extension 5   Wrist flexion    Wrist extension    Wrist ulnar deviation    Wrist radial deviation    Wrist pronation    Wrist supination    Grip strength (lbs)    (Blank rows = not tested) RUE MMT deferred due to post-op acuity   PALPATION:  Not assessed  [facility] Adult PT Treatment:                                                DATE: 10/10/23 Therapeutic Exercise: UBE level 4 x 2 min each fwd/bwd  Reverse fly 3 x 10; 5 lbs  Supine lat pull 2 x 10 @ 5 lbs  Overhead press 2 x 10 @ 5 lbs   Neuromuscular re-ed: Prone ER 90/90 med ball catch 2 x 20; green mad ball  Body blade PNF D2 2 x 5 Slider lat pull 2 x 10  PNF D2 resistance at matrix 2 x 8; 5 lbs  Therapeutic Activity: Seated med ball 90/90 eccentric catch and toss 2 x 10 Overhead squat with 5 lb bar x 10     [facility] Adult PT  Treatment:                                                DATE: 10/05/23 Therapeutic Exercise: UBE level 3 x 2 min each fwd/bwd  Chest press 3 x 10 ( 5 lb dumbbells first set, 10 lb bar 2-3 set)  Chest fly 2 x 10 5 lb dumbbell  Lat pull down 2 x 10 @ 20 lbs  Neuromuscular re-ed: Shoulder diagonals and horizontal abduction yellow band 2 x 10  PNF D1/D2 resistance x 5 each  High plank on bosu with tennis ball target x 5  Physioball walk out 2 x 5  Wall ball circles 2 x 20 each cw/ccw; flexion and abduction  Bentover row with weighted bar 2 x 15 @ 10 lbs     [facility] Adult PT Treatment:                                                DATE: 10/03/23 Therapeutic Exercise: UBE level 3 x 2 min each fwd/bwd  Hammer curl 2 x 10 @ 10 lbs  HEP review   Neuromuscular re-ed: Wall clock taps with red med ball 2 x 5  Prone row to ER 3 lbs, 2 x 15  Standing shoulder flexion with red band pulses 2 x 5 Standing resisted rows 2 x  15 @ 35 lbs  Body blade 2 x 20 sec each neutral and pronated shoulder flexed at 90 degrees Serratus wall slides with red band 2 x 15     [facility] Adult PT Treatment:                                                DATE: 09/28/23 Therapeutic Exercise: Bicep curl 10 lbs 2 x 15 Reviewed and updated HEP   Neuromuscular re-ed: 90/90 ball toss at wall green med ball 2 x 20  Body blade elbow at 90 degrees neutral and pronated 2 x 20 sec each Sit up med ball toss 2 x 10 @ 6 lbs  Therapeutic Activity: Re-assessment to determine overall progress, educating patient on progress towards goals  Standing shoulder flexion 2 x 15 @ 3 lbs  Overhead press 2 x 15 @ 4 lbs  Farmer's carry 2 laps 20 lbs      PATIENT EDUCATION: Education details: HEP review   Person educated: Patient Education method: Programmer, multimedia,  Education comprehension: verbalized understanding,   HOME EXERCISE PROGRAM: Access Code: [facility] URL: [email or web address] Date: 10/05/2023 Prepared by: Lucie Meeter  Exercises - Prone W Scapular Retraction  - 1 x daily - 3 x weekly - 2 sets - 15 reps - Prone Shoulder Horizontal Abduction with Thumbs Up  - 1 x daily - 3 x weekly - 2 sets - 15 reps - Prone Single Arm Shoulder Y  - 1 x daily - 3 x weekly - 2 sets - 15 reps - Standing Shoulder Row with Anchored Resistance  - 1 x daily - 3 x weekly - 2 sets - 10 reps - Sidelying Shoulder External Rotation  - 1 x daily - 3 x weekly -  2 sets - 10 reps - Shoulder External Rotation with Anchored Resistance  - 1 x daily - 3 x weekly - 2 sets - 10 reps - Shoulder External Rotation and Scapular Retraction with Resistance  - 1 x daily - 3 x weekly - 2 sets - 10 reps - Supine Serratus Punches Resistance  - 1 x daily - 3 x weekly - 2 sets - 10 reps - Standing Elbow Extension with Self-Anchored Resistance  - 1 x daily - 3 x weekly - 2 sets - 10 reps - Standard Plank  - 1 x daily - 3 x weekly - 3 sets - 30 sec  hold - Standing Bent Over  Single Arm Shoulder Row  - 1 x daily - 3 x weekly - 3 sets - 10 reps - Bird Dog  - 1 x daily - 3 x weekly - 3 sets - 10 reps - Standing Bicep Curls Supinated with Dumbbells  - 1 x daily - 3 x weekly - 2 sets - 15 reps - Standing Shoulder Flexion to 90 Degrees with Dumbbells  - 1 x daily - 3 x weekly - 2 sets - 15 reps - Standing Shoulder Diagonal Horizontal Abduction 60/120 Degrees with Resistance  - 1 x daily - 3 x weekly - 2 sets - 10 reps  ASSESSMENT:  CLINICAL IMPRESSION: Introduced sport specific activity with fairly good tolerance. Did note tightness and discomfort initially at end range of 90/90 ER. With continued reps of eccentric ball toss this tightness and discomfort resolved. Progressed into modified push-up on knees with patient demonstrating good form without onset of pain.   OBJECTIVE IMPAIRMENTS: decreased activity tolerance, decreased endurance, decreased knowledge of condition, decreased mobility, decreased ROM, decreased strength, increased edema, impaired UE functional use, postural dysfunction, and pain.    GOALS: Goals reviewed with patient? Yes  SHORT TERM GOALS: Target date: 08/10/2023   Patient will be independent and compliant with initial HEP.  Baseline: initial HEP issued  Goal status: MET  2.  Patient will demonstrate full Rt elbow extension AROM to improve ability to complete reaching activity.  Baseline: see above Goal status: MET  3.  Patient will demonstrate at least 120 degrees of Rt shoulder AROM to improve ability to reach overhead.  Baseline: shoulder ROM deferred  Goal status: MET  4.  Patient will demonstrate at least 50 degrees of Rt shoulder ER/IR AROM to improve ability to complete self-care activities.  Baseline: shoulder ROM deferred  Goal status: MET    LONG TERM GOALS: Target date: 09/23/23  Patient will score </= 20 % disability on the QuickDASH (MCID is 8-15.9) to signify clinically meaningful improvement in functional abilities.   Baseline: see above Goal status: MET  2.  Patient will demonstrate 5/5 Rt shoulder strength to improve ability to throw baseball.  Baseline: deferred  Goal status: partially met   3.  Patient will demonstrate 5/5 Rt elbow strength to improve ability to lift/carry items.  Baseline: deferred Goal status: partially met   4.  Patient will be able to perform strengthening/stabilization exercises in 90/90 positioning without onset of shoulder pain in order to progress to throwing activity.  Baseline: unable  09/28/23: has experienced mild pain with 90/90 ER strengthening  Goal status: progressing   5. Patient will demonstrate 5/5 bilateral middle trap strength to improve postural stability.   Baseline: deferred  Goal Status: progressing    PLAN: PT FREQUENCY: 1-2x/week  PT DURATION: 6 weeks  PLANNED INTERVENTIONS: 02835- PT Re-evaluation,  02249- Physical Performance Testing, 97110-Therapeutic exercises, 97530- Therapeutic activity, [id number]- Neuromuscular re-education, 97535- Self Care, 02859- Manual therapy, [id number]- Aquatic Therapy, [phone]- Electrical stimulation (unattended), [id number]- Electrical stimulation (manual), 97016- Vasopneumatic device, Taping, Dry Needling, Joint mobilization, Cryotherapy, and Moist heat  PLAN FOR NEXT SESSION: continue per protocol; sport specifics; sled push/pull   Lucie Meeter, PT, DPT, ATC 10/10/23 8:45 AM

## 2023-10-17 ENCOUNTER — Ambulatory Visit

## 2023-10-17 DIAGNOSIS — M6281 Muscle weakness (generalized): Secondary | ICD-10-CM

## 2023-10-17 DIAGNOSIS — R600 Localized edema: Secondary | ICD-10-CM

## 2023-10-17 DIAGNOSIS — M25511 Pain in right shoulder: Secondary | ICD-10-CM | POA: Diagnosis not present

## 2023-10-17 DIAGNOSIS — M25611 Stiffness of right shoulder, not elsewhere classified: Secondary | ICD-10-CM

## 2023-10-17 NOTE — Therapy (Signed)
 OUTPATIENT PHYSICAL THERAPY UPPER EXTREMITY TREATMENT    Patient Name: Brian Holmes MRN: [id number] DOB:2000/07/21, 23 y.o., male Today's Date: 10/17/2023  END OF SESSION:  PT End of Session - 10/17/23 0850     Visit Number 23    Number of Visits 31    Date for PT Re-Evaluation 11/11/23    Authorization Type BCBS    PT Start Time 0847    PT Stop Time 0928    PT Time Calculation (min) 41 min    Activity Tolerance Patient tolerated treatment well    Behavior During Therapy [facility] for tasks assessed/performed                        History reviewed. No pertinent past medical history. 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    PCP: Geofm Waldo HERO, MD  REFERRING PROVIDER: Prentiss Lesches, MD  REFERRING DIAG: strain of muscles and tendons of the rotator cuff of right shoulder, subsequent encounter   THERAPY DIAG:  Acute pain of right shoulder  Stiffness of right shoulder, not elsewhere classified  Muscle weakness (generalized)  Localized edema  Rationale for Evaluation and Treatment: Rehabilitation  ONSET DATE: 06/20/23  SUBJECTIVE:                                                                                                                                                                                      SUBJECTIVE STATEMENT: Patient reports the shoulder feels good. Was able to do a chest workout at the gym over the weekend without issues.   EVAL: Patient underwent Rt shoulder surgery on 06/20/23. He has been in his sling for all activity except for showering since the surgery. 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 Patient wants to be able to play baseball  for fun, but not competively. Pitches Rt-handed; Bats Lt-handed.  Hand dominance: Ambidextrous  PERTINENT HISTORY: Rt shoulder bicep tenodesis 06/20/23 Patient underwent Rt shoulder surgery on 06/20/23. He has been in his sling for all activity except for showering since the surgery. 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 Patient wants to be able to play baseball for fun, but not competively. Pitches Rt-handed; Bats Lt-handed.  PAIN:  Are you  having pain? No  PRECAUTIONS: Other: see post-op protocol- CKC as tolerated, weight lifting in 8 weeks 08/22/23  WEIGHT BEARING RESTRICTIONS: Yes WBAT RUE  FALLS:  Has patient fallen in last 6 months? 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   NEXT MD VISIT: 08/17/23  OBJECTIVE:  Note: Objective measures were completed at Evaluation unless otherwise noted. PATIENT SURVEYS :  Quick Dash 77.8% disability  QuickDASH 11.4% disability   COGNITION: Overall cognitive status: Within functional limits for tasks assessed     POSTURE: Rounder shoulders in sling   UPPER EXTREMITY ROM: shoulder PROM deferred  Passive ROM Right eval Left eval Right 5/27 PROM 07/11/23 Rt PROM 07/14/23 Rt PROM 07/18/23 Rt AROM 07/25/23 Rt AROM 08/10/23 Rt AROM 08/17/23 Rt AROM   Shoulder flexion   At 30 degrees abd: 92  Full  169  Full  Full   Shoulder extension           Shoulder abduction   35 130    155 175  Shoulder adduction           Shoulder internal rotation       61 70 80  Shoulder external rotation   At 30 degrees abd: 20    66 91 100  Elbow flexion Full           Elbow extension Lacking 40     0     Wrist flexion           Wrist extension           Wrist ulnar deviation           Wrist radial deviation           Wrist pronation           Wrist supination           (Blank rows = not tested)  UPPER  EXTREMITY MMT: MMT Right 09/28/23 Left 09/28/23   Shoulder flexion 4   Shoulder extension    Shoulder abduction 5   Shoulder adduction    Shoulder internal rotation 5   Shoulder external rotation 4+   Middle trapezius 4 4  Lower trapezius    Elbow flexion 4   Elbow extension 5   Wrist flexion    Wrist extension    Wrist ulnar deviation    Wrist radial deviation    Wrist pronation    Wrist supination    Grip strength (lbs)    (Blank rows = not tested) RUE MMT deferred due to post-op acuity   PALPATION:  Not assessed  [facility] Adult PT Treatment:                                                DATE: 10/17/23 Therapeutic Exercise: UBE level 5 x 2 min each fwd/bwd  Supine lat pull 2 x 10 @ 6 lbs  90/90 ER 2 x 10 @ 3 lbs  90/90 standing fly 2 x 10 @ 6 lbs  Seated overhead press x 5 @ 6 lbs   Neuromuscular re-ed: Body blade PNF D2 flexion/extension 2 x 5  Physioball perturbations at wall flexion and abduction 3 x 20 sec each  Tall plank dumbbell pass 2 x 10 @ 6 lbs  Therapeutic Activity:  Sled push/pull 50 lbs 3 x 25 ft  Med ball throw to rebounder 2 x 10; green med ball   Self Care: Slow progression into bicep/overhead strengthening at gym.    [facility] Adult PT Treatment:                                                DATE: 10/10/23 Therapeutic Exercise: UBE level 4 x 2 min each fwd/bwd  Reverse fly 3 x 10; 5 lbs  Supine lat pull 2 x 10 @ 5 lbs  Overhead press 2 x 10 @ 5 lbs   Neuromuscular re-ed: Prone ER 90/90 med ball catch 2 x 20; green mad ball  Body blade PNF D2 2 x 5 Slider lat pull 2 x 10  PNF D2 resistance at matrix 2 x 8; 5 lbs  Therapeutic Activity: Seated med ball 90/90 eccentric catch and toss 2 x 10 Overhead squat with 5 lb bar x 10     [facility] Adult PT Treatment:                                                DATE: 10/05/23 Therapeutic Exercise: UBE level 3 x 2 min each fwd/bwd  Chest press 3 x 10 ( 5 lb dumbbells first set, 10 lb bar 2-3 set)  Chest fly 2 x  10 5 lb dumbbell  Lat pull down 2 x 10 @ 20 lbs  Neuromuscular re-ed: Shoulder diagonals and horizontal abduction yellow band 2 x 10  PNF D1/D2 resistance x 5 each  High plank on bosu with tennis ball target x 5  Physioball walk out 2 x 5  Wall ball circles 2 x 20 each cw/ccw; flexion and abduction  Bentover row with weighted bar 2 x 15 @ 10 lbs     [facility] Adult PT Treatment:                                                DATE: 10/03/23 Therapeutic Exercise: UBE level 3 x 2 min each fwd/bwd  Hammer curl 2 x 10 @ 10 lbs  HEP review   Neuromuscular re-ed: Wall clock taps with red med ball 2 x 5  Prone row to ER 3 lbs, 2 x 15  Standing shoulder flexion with red band pulses 2 x 5 Standing resisted rows 2 x 15 @ 35 lbs  Body blade 2 x 20 sec each neutral and pronated shoulder flexed at 90 degrees Serratus wall slides with red band 2 x 15     [facility] Adult PT Treatment:                                                DATE: 09/28/23 Therapeutic Exercise: Bicep curl 10 lbs 2 x 15 Reviewed and updated HEP   Neuromuscular re-ed: 90/90 ball toss at wall green med ball 2 x 20  Body blade elbow at 90 degrees neutral and pronated 2 x 20 sec  each Sit up med ball toss 2 x 10 @ 6 lbs  Therapeutic Activity: Re-assessment to determine overall progress, educating patient on progress towards goals  Standing shoulder flexion 2 x 15 @ 3 lbs  Overhead press 2 x 15 @ 4 lbs  Farmer's carry 2 laps 20 lbs      PATIENT EDUCATION: Education details: HEP review   Person educated: Patient Education method: Programmer, multimedia,  Education comprehension: verbalized understanding,   HOME EXERCISE PROGRAM: Access Code: [facility] URL: [email or web address] Date: 10/05/2023 Prepared by: Lucie Meeter  Exercises - Prone W Scapular Retraction  - 1 x daily - 3 x weekly - 2 sets - 15 reps - Prone Shoulder Horizontal Abduction with Thumbs Up  - 1 x daily - 3 x weekly - 2 sets - 15 reps - Prone Single  Arm Shoulder Y  - 1 x daily - 3 x weekly - 2 sets - 15 reps - Standing Shoulder Row with Anchored Resistance  - 1 x daily - 3 x weekly - 2 sets - 10 reps - Sidelying Shoulder External Rotation  - 1 x daily - 3 x weekly - 2 sets - 10 reps - Shoulder External Rotation with Anchored Resistance  - 1 x daily - 3 x weekly - 2 sets - 10 reps - Shoulder External Rotation and Scapular Retraction with Resistance  - 1 x daily - 3 x weekly - 2 sets - 10 reps - Supine Serratus Punches Resistance  - 1 x daily - 3 x weekly - 2 sets - 10 reps - Standing Elbow Extension with Self-Anchored Resistance  - 1 x daily - 3 x weekly - 2 sets - 10 reps - Standard Plank  - 1 x daily - 3 x weekly - 3 sets - 30 sec  hold - Standing Bent Over Single Arm Shoulder Row  - 1 x daily - 3 x weekly - 3 sets - 10 reps - Bird Dog  - 1 x daily - 3 x weekly - 3 sets - 10 reps - Standing Bicep Curls Supinated with Dumbbells  - 1 x daily - 3 x weekly - 2 sets - 15 reps - Standing Shoulder Flexion to 90 Degrees with Dumbbells  - 1 x daily - 3 x weekly - 2 sets - 15 reps - Standing Shoulder Diagonal Horizontal Abduction 60/120 Degrees with Resistance  - 1 x daily - 3 x weekly - 2 sets - 10 reps  ASSESSMENT:  CLINICAL IMPRESSION: Introduced pushing and pulling activity with the sled today with good tolerance. Progressed strengthening into throwing positioning without onset of pain. Introduced overhead throwing with patient reporting tightness at available end range of external rotation, but no onset of pain.   OBJECTIVE IMPAIRMENTS: decreased activity tolerance, decreased endurance, decreased knowledge of condition, decreased mobility, decreased ROM, decreased strength, increased edema, impaired UE functional use, postural dysfunction, and pain.    GOALS: Goals reviewed with patient? Yes  SHORT TERM GOALS: Target date: 08/10/2023   Patient will be independent and compliant with initial HEP.  Baseline: initial HEP issued  Goal status:  MET  2.  Patient will demonstrate full Rt elbow extension AROM to improve ability to complete reaching activity.  Baseline: see above Goal status: MET  3.  Patient will demonstrate at least 120 degrees of Rt shoulder AROM to improve ability to reach overhead.  Baseline: shoulder ROM deferred  Goal status: MET  4.  Patient will demonstrate at least 50 degrees of Rt shoulder ER/IR  AROM to improve ability to complete self-care activities.  Baseline: shoulder ROM deferred  Goal status: MET    LONG TERM GOALS: Target date: 09/23/23  Patient will score </= 20 % disability on the QuickDASH (MCID is 8-15.9) to signify clinically meaningful improvement in functional abilities.  Baseline: see above Goal status: MET  2.  Patient will demonstrate 5/5 Rt shoulder strength to improve ability to throw baseball.  Baseline: deferred  Goal status: partially met   3.  Patient will demonstrate 5/5 Rt elbow strength to improve ability to lift/carry items.  Baseline: deferred Goal status: partially met   4.  Patient will be able to perform strengthening/stabilization exercises in 90/90 positioning without onset of shoulder pain in order to progress to throwing activity.  Baseline: unable  09/28/23: has experienced mild pain with 90/90 ER strengthening  Goal status: progressing   5. Patient will demonstrate 5/5 bilateral middle trap strength to improve postural stability.   Baseline: deferred  Goal Status: progressing    PLAN: PT FREQUENCY: 1-2x/week  PT DURATION: 6 weeks  PLANNED INTERVENTIONS: 97164- PT Re-evaluation, 97750- Physical Performance Testing, 97110-Therapeutic exercises, 97530- Therapeutic activity, [id number]- Neuromuscular re-education, 97535- Self Care, 02859- Manual therapy, [id number]- Aquatic Therapy, H9716- Electrical stimulation (unattended), [id number]- Electrical stimulation (manual), 97016- Vasopneumatic device, Taping, Dry Needling, Joint mobilization, Cryotherapy, and Moist heat  PLAN  FOR NEXT SESSION: continue per protocol; sport specifics;   Lucie Meeter, PT, DPT, ATC 10/17/23 9:32 AM

## 2023-10-24 ENCOUNTER — Ambulatory Visit

## 2023-10-24 DIAGNOSIS — R600 Localized edema: Secondary | ICD-10-CM

## 2023-10-24 DIAGNOSIS — M25511 Pain in right shoulder: Secondary | ICD-10-CM

## 2023-10-24 DIAGNOSIS — M6281 Muscle weakness (generalized): Secondary | ICD-10-CM

## 2023-10-24 DIAGNOSIS — M25611 Stiffness of right shoulder, not elsewhere classified: Secondary | ICD-10-CM

## 2023-10-24 NOTE — Therapy (Signed)
 OUTPATIENT PHYSICAL THERAPY UPPER EXTREMITY TREATMENT    Patient Name: Brian Holmes MRN: [id number] DOB:November 07, 2000, 23 y.o., male Today's Date: 10/24/2023  END OF SESSION:  PT End of Session - 10/24/23 0803     Visit Number 24    Number of Visits 31    Date for PT Re-Evaluation 11/11/23    Authorization Type BCBS    PT Start Time 0803    PT Stop Time 0845    PT Time Calculation (min) 42 min    Activity Tolerance Patient tolerated treatment well    Behavior During Therapy [facility] for tasks assessed/performed                         History reviewed. No pertinent past medical history. 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    PCP: Geofm Waldo HERO, MD  REFERRING PROVIDER: Prentiss Lesches, MD  REFERRING DIAG: strain of muscles and tendons of the rotator cuff of right shoulder, subsequent encounter   THERAPY DIAG:  Acute pain of right shoulder  Stiffness of right shoulder, not elsewhere classified  Muscle weakness (generalized)  Localized edema  Rationale for Evaluation and Treatment: Rehabilitation  ONSET DATE: 06/20/23  SUBJECTIVE:                                                                                                                                                                                      SUBJECTIVE STATEMENT: Patient reports he was able to bench press and do chest fly at the gym without issues. Has not thrown a baseball yet.   EVAL: Patient underwent Rt shoulder surgery on 06/20/23. He has been in his sling for all activity except for showering since the surgery. 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 Patient wants to be able to play  baseball for fun, but not competively. Pitches Rt-handed; Bats Lt-handed.  Hand dominance: Ambidextrous  PERTINENT HISTORY: Rt shoulder bicep tenodesis 06/20/23 Patient underwent Rt shoulder surgery on 06/20/23. He has been in his sling for all activity except for showering since the surgery. 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 Patient wants to be able to play baseball for fun, but not competively. Pitches Rt-handed; Bats Lt-handed.  PAIN:  Are you having pain? No  PRECAUTIONS: Other: see post-op protocol- CKC as tolerated, weight lifting in 8 weeks 08/22/23  WEIGHT BEARING RESTRICTIONS: Yes WBAT RUE  FALLS:  Has patient fallen in last 6 months? 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   NEXT MD VISIT: 08/17/23  OBJECTIVE:  Note: Objective measures were completed at Evaluation unless otherwise noted. PATIENT SURVEYS :  Quick Dash 77.8% disability  QuickDASH 11.4% disability   COGNITION: Overall cognitive status: Within functional limits for tasks assessed     POSTURE: Rounder shoulders in sling   UPPER EXTREMITY ROM: shoulder PROM deferred  Passive ROM Right eval Left eval Right 5/27 PROM 07/11/23 Rt PROM 07/14/23 Rt PROM 07/18/23 Rt AROM 07/25/23 Rt AROM 08/10/23 Rt AROM 08/17/23 Rt AROM   Shoulder flexion   At 30 degrees abd: 92  Full  169  Full  Full   Shoulder extension           Shoulder abduction   35 130    155 175  Shoulder adduction           Shoulder internal rotation       61 70 80  Shoulder external rotation   At 30 degrees abd: 20    66 91 100  Elbow flexion Full           Elbow extension Lacking 40     0     Wrist flexion           Wrist extension           Wrist ulnar deviation           Wrist radial deviation           Wrist pronation           Wrist supination           (Blank rows = not  tested)  UPPER EXTREMITY MMT: MMT Right 09/28/23 Left 09/28/23   Shoulder flexion 4   Shoulder extension    Shoulder abduction 5   Shoulder adduction    Shoulder internal rotation 5   Shoulder external rotation 4+   Middle trapezius 4 4  Lower trapezius    Elbow flexion 4   Elbow extension 5   Wrist flexion    Wrist extension    Wrist ulnar deviation    Wrist radial deviation    Wrist pronation    Wrist supination    Grip strength (lbs)    (Blank rows = not tested) RUE MMT deferred due to post-op acuity   PALPATION:  Not assessed  [facility] Adult PT Treatment:                                                DATE: 10/24/23 Therapeutic Exercise: UBE level 5 x 2 min each fwd/bwd  Pec stretch on roller x 1 minute   Neuromuscular re-ed: Rhythmic stabilization in various flexed and rotated positioning of the Rt shoulder x 20 sec each Prone T and W on physioball 2# 2 x 15  Therapeutic Activity: Baseball throwing x 10 @ 15 ft, x 10 @ 20 ft @ 20% max speed into basket Pitching throw (no release) x 5   Self Care: Begin baseball throwing 2 x week;  max 30 throws work up velocity from short distance Gradual return to golf    [facility] Adult PT Treatment:                                                DATE: 10/17/23 Therapeutic Exercise: UBE level 5 x 2 min each fwd/bwd  Supine lat pull 2 x 10 @ 6 lbs  90/90 ER 2 x 10 @ 3 lbs  90/90 standing fly 2 x 10 @ 6 lbs  Seated overhead press x 5 @ 6 lbs   Neuromuscular re-ed: Body blade PNF D2 flexion/extension 2 x 5  Physioball perturbations at wall flexion and abduction 3 x 20 sec each  Tall plank dumbbell pass 2 x 10 @ 6 lbs  Therapeutic Activity: Sled push/pull 50 lbs 3 x 25 ft  Med ball throw to rebounder 2 x 10; green med ball   Self Care: Slow progression into bicep/overhead strengthening at gym.    [facility] Adult PT Treatment:                                                DATE: 10/10/23 Therapeutic Exercise: UBE level 4 x 2 min each  fwd/bwd  Reverse fly 3 x 10; 5 lbs  Supine lat pull 2 x 10 @ 5 lbs  Overhead press 2 x 10 @ 5 lbs   Neuromuscular re-ed: Prone ER 90/90 med ball catch 2 x 20; green mad ball  Body blade PNF D2 2 x 5 Slider lat pull 2 x 10  PNF D2 resistance at matrix 2 x 8; 5 lbs  Therapeutic Activity: Seated med ball 90/90 eccentric catch and toss 2 x 10 Overhead squat with 5 lb bar x 10      PATIENT EDUCATION: Education details: HEP review   Person educated: Patient Education method: Programmer, multimedia,  Education comprehension: verbalized understanding,   HOME EXERCISE PROGRAM: Access Code: [facility] URL: [email or web address] Date: 10/05/2023 Prepared by: Lucie Meeter  Exercises - Prone W Scapular Retraction  - 1 x daily - 3 x weekly - 2 sets - 15 reps - Prone Shoulder Horizontal Abduction with Thumbs Up  - 1 x daily - 3 x weekly - 2 sets - 15 reps - Prone Single Arm Shoulder Y  - 1 x daily - 3 x weekly - 2 sets - 15 reps - Standing Shoulder Row with Anchored Resistance  - 1 x daily - 3 x weekly - 2 sets - 10 reps - Sidelying Shoulder External Rotation  - 1 x daily - 3 x weekly - 2 sets - 10 reps - Shoulder External Rotation with Anchored Resistance  - 1 x daily - 3 x weekly - 2 sets - 10 reps - Shoulder External Rotation and Scapular Retraction with Resistance  - 1 x daily - 3 x weekly - 2 sets - 10 reps - Supine Serratus Punches Resistance  - 1 x daily - 3 x weekly - 2 sets - 10 reps - Standing Elbow Extension with Self-Anchored Resistance  - 1 x daily - 3 x weekly - 2 sets - 10 reps - Standard Plank  - 1 x daily - 3 x weekly - 3 sets - 30 sec  hold -  Standing Bent Over Single Arm Shoulder Row  - 1 x daily - 3 x weekly - 3 sets - 10 reps - Bird Dog  - 1 x daily - 3 x weekly - 3 sets - 10 reps - Standing Bicep Curls Supinated with Dumbbells  - 1 x daily - 3 x weekly - 2 sets - 15 reps - Standing Shoulder Flexion to 90 Degrees with Dumbbells  - 1 x daily - 3 x weekly - 2 sets -  15 reps - Standing Shoulder Diagonal Horizontal Abduction 60/120 Degrees with Resistance  - 1 x daily - 3 x weekly - 2 sets - 10 reps  ASSESSMENT:  CLINICAL IMPRESSION: Introduced short distance baseball throwing and minimal velocity today. No reports of pain with throwing, but does endorse tightness at available end range of external rotation. He was able to occasionally hit the target with throwing. Fatigued with periscapular strengthening, but able to maintain good form. Was encouraged to begin throwing 2 x week with patient verbalizing understanding.   OBJECTIVE IMPAIRMENTS: decreased activity tolerance, decreased endurance, decreased knowledge of condition, decreased mobility, decreased ROM, decreased strength, increased edema, impaired UE functional use, postural dysfunction, and pain.    GOALS: Goals reviewed with patient? Yes  SHORT TERM GOALS: Target date: 08/10/2023   Patient will be independent and compliant with initial HEP.  Baseline: initial HEP issued  Goal status: MET  2.  Patient will demonstrate full Rt elbow extension AROM to improve ability to complete reaching activity.  Baseline: see above Goal status: MET  3.  Patient will demonstrate at least 120 degrees of Rt shoulder AROM to improve ability to reach overhead.  Baseline: shoulder ROM deferred  Goal status: MET  4.  Patient will demonstrate at least 50 degrees of Rt shoulder ER/IR AROM to improve ability to complete self-care activities.  Baseline: shoulder ROM deferred  Goal status: MET    LONG TERM GOALS: Target date: 09/23/23  Patient will score </= 20 % disability on the QuickDASH (MCID is 8-15.9) to signify clinically meaningful improvement in functional abilities.  Baseline: see above Goal status: MET  2.  Patient will demonstrate 5/5 Rt shoulder strength to improve ability to throw baseball.  Baseline: deferred  Goal status: partially met   3.  Patient will demonstrate 5/5 Rt elbow strength to  improve ability to lift/carry items.  Baseline: deferred Goal status: partially met   4.  Patient will be able to perform strengthening/stabilization exercises in 90/90 positioning without onset of shoulder pain in order to progress to throwing activity.  Baseline: unable  09/28/23: has experienced mild pain with 90/90 ER strengthening  Goal status: progressing   5. Patient will demonstrate 5/5 bilateral middle trap strength to improve postural stability.   Baseline: deferred  Goal Status: progressing    PLAN: PT FREQUENCY: 1-2x/week  PT DURATION: 6 weeks  PLANNED INTERVENTIONS: 97164- PT Re-evaluation, 97750- Physical Performance Testing, 97110-Therapeutic exercises, 97530- Therapeutic activity, [id number]- Neuromuscular re-education, 97535- Self Care, [id number]- Manual therapy, [id number]- Aquatic Therapy, H9716- Electrical stimulation (unattended), [id number]- Electrical stimulation (manual), 97016- Vasopneumatic device, Taping, Dry Needling, Joint mobilization, Cryotherapy, and Moist heat  PLAN FOR NEXT SESSION: continue per protocol; sport specifics;   Lucie Meeter, PT, DPT, ATC 10/24/23 8:48 AM

## 2023-10-31 ENCOUNTER — Ambulatory Visit

## 2023-10-31 DIAGNOSIS — M25511 Pain in right shoulder: Secondary | ICD-10-CM | POA: Diagnosis not present

## 2023-10-31 DIAGNOSIS — M6281 Muscle weakness (generalized): Secondary | ICD-10-CM

## 2023-10-31 DIAGNOSIS — R600 Localized edema: Secondary | ICD-10-CM

## 2023-10-31 DIAGNOSIS — M25611 Stiffness of right shoulder, not elsewhere classified: Secondary | ICD-10-CM

## 2023-10-31 NOTE — Therapy (Signed)
 OUTPATIENT PHYSICAL THERAPY UPPER EXTREMITY TREATMENT    Patient Name: Brian Holmes MRN: [id number] DOB:26-Oct-2000, 23 y.o., male Today's Date: 10/31/2023  END OF SESSION:  PT End of Session - 10/31/23 0803     Visit Number 25    Number of Visits 31    Date for Recertification  11/11/23    Authorization Type BCBS    PT Start Time 0801    PT Stop Time 0842    PT Time Calculation (min) 41 min    Activity Tolerance Patient tolerated treatment well    Behavior During Therapy [facility] for tasks assessed/performed                          History reviewed. No pertinent past medical history. 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    PCP: Geofm Waldo HERO, MD  REFERRING PROVIDER: Prentiss Lesches, MD  REFERRING DIAG: strain of muscles and tendons of the rotator cuff of right shoulder, subsequent encounter   THERAPY DIAG:  Acute pain of right shoulder  Stiffness of right shoulder, not elsewhere classified  Muscle weakness (generalized)  Localized edema  Rationale for Evaluation and Treatment: Rehabilitation  ONSET DATE: 06/20/23  SUBJECTIVE:                                                                                                                                                                                      SUBJECTIVE STATEMENT: Patient was able to throw the baseball for about 20 throws at 20-25 ft at slow velocity without onset of pain, but feels tight on the late cocking phase. No pain.   EVAL: Patient underwent Rt shoulder surgery on 06/20/23. He has been in his sling for all activity except for showering since the surgery. 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 Does not plan to return to collegiate  baseball. Patient wants to be able to play baseball for fun, but not competively. Pitches Rt-handed; Bats Lt-handed.  Hand dominance: Ambidextrous  PERTINENT HISTORY: Rt shoulder bicep tenodesis 06/20/23 Patient underwent Rt shoulder surgery on 06/20/23. He has been in his sling for all activity except for showering since the surgery. 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 Patient wants to be able to play baseball for fun, but  not competively. Pitches Rt-handed; Bats Lt-handed.  PAIN:  Are you having pain? No  PRECAUTIONS: Other: see post-op protocol- CKC as tolerated, weight lifting in 8 weeks 08/22/23  WEIGHT BEARING RESTRICTIONS: Yes WBAT RUE  FALLS:  Has patient fallen in last 6 months? 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   NEXT MD VISIT: 08/17/23  OBJECTIVE:  Note: Objective measures were completed at Evaluation unless otherwise noted. PATIENT SURVEYS :  Quick Dash 77.8% disability  QuickDASH 11.4% disability   COGNITION: Overall cognitive status: Within functional limits for tasks assessed     POSTURE: Rounder shoulders in sling   UPPER EXTREMITY ROM: shoulder PROM deferred  Passive ROM Right eval Left eval Right 5/27 PROM 07/11/23 Rt PROM 07/14/23 Rt PROM 07/18/23 Rt AROM 07/25/23 Rt AROM 08/10/23 Rt AROM 08/17/23 Rt AROM   Shoulder flexion   At 30 degrees abd: 92  Full  169  Full  Full   Shoulder extension           Shoulder abduction   35 130    155 175  Shoulder adduction           Shoulder internal rotation       61 70 80  Shoulder external rotation   At 30 degrees abd: 20    66 91 100  Elbow flexion Full           Elbow extension Lacking 40     0     Wrist flexion           Wrist extension           Wrist ulnar deviation           Wrist radial deviation           Wrist pronation           Wrist  supination           (Blank rows = not tested)  UPPER EXTREMITY MMT: MMT Right 09/28/23 Left 09/28/23   Shoulder flexion 4   Shoulder extension    Shoulder abduction 5   Shoulder adduction    Shoulder internal rotation 5   Shoulder external rotation 4+   Middle trapezius 4 4  Lower trapezius    Elbow flexion 4   Elbow extension 5   Wrist flexion    Wrist extension    Wrist ulnar deviation    Wrist radial deviation    Wrist pronation    Wrist supination    Grip strength (lbs)    (Blank rows = not tested) RUE MMT deferred due to post-op acuity   PALPATION:  Not assessed   [facility] Adult PT Treatment:                                                DATE: 10/31/23 Therapeutic Exercise: UBE level 4 x 2 min each fwd/bwd  Pec stretch on roller x 1 minute  Shoulder extension AAROM with dowel x 10   Neuromuscular re-ed: Shoulder horizontal abduction and diagonals red band x 10  Prone row with ER 2 x 10 @ 3 lbs  Shoulder flexion with oscillation 2 x 5, red band  Serratus wall slide with red band 2 x 10  90/90 med ball catch  and release green ball 2 x 20  90/90 rhythmic stabilization 5 x 20 sec  Therapeutic Activity: Baseball throwing at target x 10 @ 20 ft, x 10 @ 25 ft   [facility] Adult PT Treatment:                                                DATE: 10/24/23 Therapeutic Exercise: UBE level 5 x 2 min each fwd/bwd  Pec stretch on roller x 1 minute   Neuromuscular re-ed: Rhythmic stabilization in various flexed and rotated positioning of the Rt shoulder x 20 sec each Prone T and W on physioball 2# 2 x 15  Therapeutic Activity: Baseball throwing x 10 @ 15 ft, x 10 @ 20 ft @ 20% max speed into basket Pitching throw (no release) x 5   Self Care: Begin baseball throwing 2 x week; max 30 throws work up velocity from short distance Gradual return to golf    [facility] Adult PT Treatment:                                                DATE: 10/17/23 Therapeutic Exercise: UBE level 5 x 2  min each fwd/bwd  Supine lat pull 2 x 10 @ 6 lbs  90/90 ER 2 x 10 @ 3 lbs  90/90 standing fly 2 x 10 @ 6 lbs  Seated overhead press x 5 @ 6 lbs   Neuromuscular re-ed: Body blade PNF D2 flexion/extension 2 x 5  Physioball perturbations at wall flexion and abduction 3 x 20 sec each  Tall plank dumbbell pass 2 x 10 @ 6 lbs  Therapeutic Activity: Sled push/pull 50 lbs 3 x 25 ft  Med ball throw to rebounder 2 x 10; green med ball   Self Care: Slow progression into bicep/overhead strengthening at gym.    [facility] Adult PT Treatment:                                                DATE: 10/10/23 Therapeutic Exercise: UBE level 4 x 2 min each fwd/bwd  Reverse fly 3 x 10; 5 lbs  Supine lat pull 2 x 10 @ 5 lbs  Overhead press 2 x 10 @ 5 lbs   Neuromuscular re-ed: Prone ER 90/90 med ball catch 2 x 20; green mad ball  Body blade PNF D2 2 x 5 Slider lat pull 2 x 10  PNF D2 resistance at matrix 2 x 8; 5 lbs  Therapeutic Activity: Seated med ball 90/90 eccentric catch and toss 2 x 10 Overhead squat with 5 lb bar x 10      PATIENT EDUCATION: Education details: HEP review   Person educated: Patient Education method: Programmer, multimedia,  Education comprehension: verbalized understanding,   HOME EXERCISE PROGRAM: Access Code: [facility] URL: [email or web address] Date: 10/05/2023 Prepared by: Lucie Meeter  Exercises - Prone W Scapular Retraction  - 1 x daily - 3 x weekly - 2 sets - 15 reps - Prone Shoulder Horizontal Abduction with Thumbs Up  - 1 x daily - 3 x weekly - 2 sets - 15  reps - Prone Single Arm Shoulder Y  - 1 x daily - 3 x weekly - 2 sets - 15 reps - Standing Shoulder Row with Anchored Resistance  - 1 x daily - 3 x weekly - 2 sets - 10 reps - Sidelying Shoulder External Rotation  - 1 x daily - 3 x weekly - 2 sets - 10 reps - Shoulder External Rotation with Anchored Resistance  - 1 x daily - 3 x weekly - 2 sets - 10 reps - Shoulder External Rotation and Scapular  Retraction with Resistance  - 1 x daily - 3 x weekly - 2 sets - 10 reps - Supine Serratus Punches Resistance  - 1 x daily - 3 x weekly - 2 sets - 10 reps - Standing Elbow Extension with Self-Anchored Resistance  - 1 x daily - 3 x weekly - 2 sets - 10 reps - Standard Plank  - 1 x daily - 3 x weekly - 3 sets - 30 sec  hold - Standing Bent Over Single Arm Shoulder Row  - 1 x daily - 3 x weekly - 3 sets - 10 reps - Bird Dog  - 1 x daily - 3 x weekly - 3 sets - 10 reps - Standing Bicep Curls Supinated with Dumbbells  - 1 x daily - 3 x weekly - 2 sets - 15 reps - Standing Shoulder Flexion to 90 Degrees with Dumbbells  - 1 x daily - 3 x weekly - 2 sets - 15 reps - Standing Shoulder Diagonal Horizontal Abduction 60/120 Degrees with Resistance  - 1 x daily - 3 x weekly - 2 sets - 10 reps  ASSESSMENT:  CLINICAL IMPRESSION: Increased distance with baseball throwing today without onset of pain. Accuracy improved compared to previous session and patient reports less tightness in late cocking phase. Does note weakness with throwing activity. Focused on progression of shoulder stabilization and periscapular strengthening without onset of pain. He tolerated all strengthening and stability work in 90/90 positioning today without onset of pain, having met this LTG.   OBJECTIVE IMPAIRMENTS: decreased activity tolerance, decreased endurance, decreased knowledge of condition, decreased mobility, decreased ROM, decreased strength, increased edema, impaired UE functional use, postural dysfunction, and pain.    GOALS: Goals reviewed with patient? Yes  SHORT TERM GOALS: Target date: 08/10/2023   Patient will be independent and compliant with initial HEP.  Baseline: initial HEP issued  Goal status: MET  2.  Patient will demonstrate full Rt elbow extension AROM to improve ability to complete reaching activity.  Baseline: see above Goal status: MET  3.  Patient will demonstrate at least 120 degrees of Rt shoulder  AROM to improve ability to reach overhead.  Baseline: shoulder ROM deferred  Goal status: MET  4.  Patient will demonstrate at least 50 degrees of Rt shoulder ER/IR AROM to improve ability to complete self-care activities.  Baseline: shoulder ROM deferred  Goal status: MET    LONG TERM GOALS: Target date: 09/23/23  Patient will score </= 20 % disability on the QuickDASH (MCID is 8-15.9) to signify clinically meaningful improvement in functional abilities.  Baseline: see above Goal status: MET  2.  Patient will demonstrate 5/5 Rt shoulder strength to improve ability to throw baseball.  Baseline: deferred  Goal status: partially met   3.  Patient will demonstrate 5/5 Rt elbow strength to improve ability to lift/carry items.  Baseline: deferred Goal status: partially met   4.  Patient will be able to perform  strengthening/stabilization exercises in 90/90 positioning without onset of shoulder pain in order to progress to throwing activity.  Baseline: unable  09/28/23: has experienced mild pain with 90/90 ER strengthening  Goal status: MET   5. Patient will demonstrate 5/5 bilateral middle trap strength to improve postural stability.   Baseline: deferred  Goal Status: progressing    PLAN: PT FREQUENCY: 1-2x/week  PT DURATION: 6 weeks  PLANNED INTERVENTIONS: 97164- PT Re-evaluation, 97750- Physical Performance Testing, 97110-Therapeutic exercises, 97530- Therapeutic activity, [id number]- Neuromuscular re-education, 97535- Self Care, 02859- Manual therapy, [id number]- Aquatic Therapy, H9716- Electrical stimulation (unattended), [id number]- Electrical stimulation (manual), 97016- Vasopneumatic device, Taping, Dry Needling, Joint mobilization, Cryotherapy, and Moist heat  PLAN FOR NEXT SESSION: continue per protocol; sport specifics;   Lucie Meeter, PT, DPT, ATC 10/31/23 8:44 AM

## 2023-11-07 ENCOUNTER — Ambulatory Visit

## 2023-11-07 DIAGNOSIS — M25511 Pain in right shoulder: Secondary | ICD-10-CM

## 2023-11-07 DIAGNOSIS — M25611 Stiffness of right shoulder, not elsewhere classified: Secondary | ICD-10-CM

## 2023-11-07 DIAGNOSIS — R600 Localized edema: Secondary | ICD-10-CM

## 2023-11-07 DIAGNOSIS — M6281 Muscle weakness (generalized): Secondary | ICD-10-CM

## 2023-11-07 NOTE — Therapy (Signed)
 OUTPATIENT PHYSICAL THERAPY UPPER EXTREMITY TREATMENT PHYSICAL THERAPY DISCHARGE SUMMARY  Visits from Start of Care: 26  Current functional level related to goals / functional outcomes: All goals met   Remaining deficits: N/A   Education / Equipment: See education below    Patient agrees to discharge. Patient goals were met. Patient is being discharged due to meeting the stated rehab goals.    Patient Name: Brian Holmes MRN: [id number] DOB:[birth year], 23 y.o., male Today's Date: 11/07/2023  END OF SESSION:  PT End of Session - 11/07/23 0807     Visit Number 26    Number of Visits 31    Date for Recertification  11/11/23    Authorization Type BCBS    PT Start Time [phone]   patient late   PT Stop Time 0845    PT Time Calculation (min) 38 min    Activity Tolerance Patient tolerated treatment well    Behavior During Therapy [facility] for tasks assessed/performed                           History reviewed. No pertinent past medical history. 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    PCP: Geofm Waldo HERO, MD  REFERRING PROVIDER: Prentiss Lesches, MD  REFERRING DIAG: strain of muscles and tendons of the rotator cuff of right shoulder, subsequent encounter   THERAPY DIAG:  Acute pain of right shoulder  Stiffness of right shoulder, not elsewhere classified  Muscle weakness (generalized)  Localized edema  Rationale for Evaluation and Treatment: Rehabilitation  ONSET DATE: 06/20/23  SUBJECTIVE:                                                                                                                                                                                      SUBJECTIVE STATEMENT: Patient reports the shoulder feels great. Feels  that he is ready for discharge.   EVAL: Patient underwent Rt shoulder surgery on 06/20/23. He has been in his sling for all activity except for showering since the surgery. 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 Patient wants to be able to play baseball for fun, but not competively. Pitches Rt-handed; Bats Lt-handed.  Hand dominance: Ambidextrous  PERTINENT HISTORY: Rt shoulder bicep tenodesis 06/20/23 Patient underwent Rt shoulder surgery on 06/20/23. He has been in his  sling for all activity except for showering since the surgery. 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 Patient wants to be able to play baseball for fun, but not competively. Pitches Rt-handed; Bats Lt-handed.  PAIN:  Are you having pain? No  PRECAUTIONS: Other: see post-op protocol- [facility] as tolerated, weight lifting in 8 weeks 08/22/23  WEIGHT BEARING RESTRICTIONS: Yes WBAT RUE  FALLS:  Has patient fallen in last 6 months? 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   NEXT MD VISIT: 08/17/23  OBJECTIVE:  Note: Objective measures were completed at Evaluation unless otherwise noted. PATIENT SURVEYS :  Quick Dash 77.8% disability  QuickDASH 11.4% disability   COGNITION: Overall cognitive status: Within functional limits for tasks assessed     POSTURE: Rounder shoulders in sling   UPPER EXTREMITY ROM: shoulder PROM deferred  Passive ROM Right eval Left eval Right 5/27 PROM 07/11/23 Rt PROM 07/14/23 Rt PROM 07/18/23 Rt AROM 07/25/23 Rt AROM 08/10/23 Rt AROM 08/17/23 Rt AROM  11/07/23 Rt AROM  Shoulder flexion   At 30 degrees abd: 92  Full  169  Full  Full  Full   Shoulder extension            Shoulder abduction   35 130    155 175 Full   Shoulder adduction            Shoulder internal  rotation       61 70 80 90  Shoulder external rotation   At 30 degrees abd: 20    66 91 100 100  Elbow flexion Full            Elbow extension Lacking 40     0      Wrist flexion            Wrist extension            Wrist ulnar deviation            Wrist radial deviation            Wrist pronation            Wrist supination            (Blank rows = not tested)  UPPER EXTREMITY MMT: MMT Right 09/28/23 Left 09/28/23  11/07/23   Shoulder flexion 4  5 bilateral   Shoulder extension     Shoulder abduction 5  5 bilateral   Shoulder adduction     Shoulder internal rotation 5  5 bilateral   Shoulder external rotation 4+  5 bilateral   Middle trapezius 4 4 5  bilateral  Lower trapezius     Elbow flexion 4  5 bilateral   Elbow extension 5  5 bilateral   Wrist flexion     Wrist extension     Wrist ulnar deviation     Wrist radial deviation     Wrist pronation     Wrist supination     Grip strength (lbs)     (Blank rows = not tested) RUE MMT deferred due to post-op acuity   PALPATION:  Not assessed  [facility] Adult PT Treatment:  DATE: 11/07/23 Therapeutic Exercise: Reviewed and updated HEP discussing frequency, sets, reps, and ways to progress independently  Therapeutic Activity: Re-assessment to determine overall progress, educating patient on progress towards goals   Self Care: Recommend gradual return to pitching utilizing pitching coach Continue gradual gym strength progression with appropriate rest in between Continue ice as needed   [facility] Adult PT Treatment:                                                DATE: 10/31/23 Therapeutic Exercise: UBE level 4 x 2 min each fwd/bwd  Pec stretch on roller x 1 minute  Shoulder extension AAROM with dowel x 10   Neuromuscular re-ed: Shoulder horizontal abduction and diagonals red band x 10  Prone row with ER 2 x 10 @ 3 lbs  Shoulder flexion with oscillation 2 x 5, red band  Serratus wall  slide with red band 2 x 10  90/90 med ball catch and release green ball 2 x 20  90/90 rhythmic stabilization 5 x 20 sec  Therapeutic Activity: Baseball throwing at target x 10 @ 20 ft, x 10 @ 25 ft   [facility] Adult PT Treatment:                                                DATE: 10/24/23 Therapeutic Exercise: UBE level 5 x 2 min each fwd/bwd  Pec stretch on roller x 1 minute   Neuromuscular re-ed: Rhythmic stabilization in various flexed and rotated positioning of the Rt shoulder x 20 sec each Prone T and W on physioball 2# 2 x 15  Therapeutic Activity: Baseball throwing x 10 @ 15 ft, x 10 @ 20 ft @ 20% max speed into basket Pitching throw (no release) x 5   Self Care: Begin baseball throwing 2 x week; max 30 throws work up velocity from short distance Gradual return to golf    [facility] Adult PT Treatment:                                                DATE: 10/17/23 Therapeutic Exercise: UBE level 5 x 2 min each fwd/bwd  Supine lat pull 2 x 10 @ 6 lbs  90/90 ER 2 x 10 @ 3 lbs  90/90 standing fly 2 x 10 @ 6 lbs  Seated overhead press x 5 @ 6 lbs   Neuromuscular re-ed: Body blade PNF D2 flexion/extension 2 x 5  Physioball perturbations at wall flexion and abduction 3 x 20 sec each  Tall plank dumbbell pass 2 x 10 @ 6 lbs  Therapeutic Activity: Sled push/pull 50 lbs 3 x 25 ft  Med ball throw to rebounder 2 x 10; green med ball   Self Care: Slow progression into bicep/overhead strengthening at gym.    [facility] Adult PT Treatment:  DATE: 10/10/23 Therapeutic Exercise: UBE level 4 x 2 min each fwd/bwd  Reverse fly 3 x 10; 5 lbs  Supine lat pull 2 x 10 @ 5 lbs  Overhead press 2 x 10 @ 5 lbs   Neuromuscular re-ed: Prone ER 90/90 med ball catch 2 x 20; green mad ball  Body blade PNF D2 2 x 5 Slider lat pull 2 x 10  PNF D2 resistance at matrix 2 x 8; 5 lbs  Therapeutic Activity: Seated med ball 90/90 eccentric catch and toss 2 x  10 Overhead squat with 5 lb bar x 10      PATIENT EDUCATION: Education details: see treatment; d/c education  Person educated: Patient Education method: Explanation, handout  Education comprehension: verbalized understanding,   HOME EXERCISE PROGRAM: Access Code: [facility] URL: [email or web address] Date: 11/07/2023 Prepared by: Lucie Meeter  Exercises - Prone W Scapular Retraction  - 1 x daily - 2 x weekly - 2 sets - 15 reps - Prone Shoulder Horizontal Abduction with Thumbs Up  - 1 x daily - 2 x weekly - 2 sets - 15 reps - Prone Single Arm Shoulder Y  - 1 x daily - 2 x weekly - 2 sets - 15 reps - Sidelying Shoulder External Rotation  - 1 x daily - 2 x weekly - 2 sets - 10 reps - Supine Scapular Protraction in Flexion with Dumbbells  - 1 x daily - 2 x weekly - 2 sets - 15 reps - Half Kneeling Diagonal Chops with Resistance  - 1 x daily - 7 x weekly - 3 sets - 10 reps - Standing Shoulder Diagonal Horizontal Abduction 60/120 Degrees with Resistance  - 1 x daily - 3 x weekly - 2 sets - 10 reps - Shoulder External Rotation and Scapular Retraction with Resistance  - 1 x daily - 3 x weekly - 2 sets - 10 reps - Standing Single Arm Shoulder External Rotation in Abduction with Anchored Resistance  - 1 x daily - 2 x weekly - 2 sets - 10 reps - Standing Single Arm Shoulder Internal Rotation in Abduction with Anchored Resistance  - 1 x daily - 2 x weekly - 2 sets - 10 reps  ASSESSMENT:  CLINICAL IMPRESSION: Chauncey has made excellent progress in PT s/p Rt shoulder bicep tenodesis on 06/20/23. He demonstrates full and pain free Rt shoulder AROM and full strength. He has tolerated strength progression well and has recently introduced light throwing activity. He is independent with advanced home program and feels confident in continuing strength training at the gym as well as seeking out baseball specific resources to begin pitching activity. He is therefore appropriate for discharge at  this time with patient in agreement with this plan.   OBJECTIVE IMPAIRMENTS: decreased activity tolerance, decreased endurance, decreased knowledge of condition, decreased mobility, decreased ROM, decreased strength, increased edema, impaired UE functional use, postural dysfunction, and pain.    GOALS: Goals reviewed with patient? Yes  SHORT TERM GOALS: Target date: 08/10/2023   Patient will be independent and compliant with initial HEP.  Baseline: initial HEP issued  Goal status: MET  2.  Patient will demonstrate full Rt elbow extension AROM to improve ability to complete reaching activity.  Baseline: see above Goal status: MET  3.  Patient will demonstrate at least 120 degrees of Rt shoulder AROM to improve ability to reach overhead.  Baseline: shoulder ROM deferred  Goal status: MET  4.  Patient will demonstrate at least 50 degrees of Rt  shoulder ER/IR AROM to improve ability to complete self-care activities.  Baseline: shoulder ROM deferred  Goal status: MET    LONG TERM GOALS: Target date: 09/23/23  Patient will score </= 20 % disability on the QuickDASH (MCID is [phone]) to signify clinically meaningful improvement in functional abilities.  Baseline: see above Goal status: MET  2.  Patient will demonstrate 5/5 Rt shoulder strength to improve ability to throw baseball.  Baseline: deferred  Goal status: MET  3.  Patient will demonstrate 5/5 Rt elbow strength to improve ability to lift/carry items.  Baseline: deferred Goal status: MET  4.  Patient will be able to perform strengthening/stabilization exercises in 90/90 positioning without onset of shoulder pain in order to progress to throwing activity.  Baseline: unable  09/28/23: has experienced mild pain with 90/90 ER strengthening  Goal status: MET   5. Patient will demonstrate 5/5 bilateral middle trap strength to improve postural stability.   Baseline: deferred  Goal Status: MET  PLAN: PT FREQUENCY:  1-2x/week  PT DURATION: 6 weeks  PLANNED INTERVENTIONS: 97164- PT Re-evaluation, 97750- Physical Performance Testing, 97110-Therapeutic exercises, 97530- Therapeutic activity, [id number]- Neuromuscular re-education, 97535- Self Care, [id number]- Manual therapy, [id number]- Aquatic Therapy, [id number]- Electrical stimulation (unattended), [id number]- Electrical stimulation (manual), 97016- Vasopneumatic device, Taping, Dry Needling, Joint mobilization, Cryotherapy, and Moist heat    Lucie Meeter, PT, DPT, ATC 11/07/23 10:27 AM
